# Patient Record
Sex: Female | Born: 1990 | Race: Black or African American | Hispanic: No | Marital: Single | Smoking: Never smoker
Health system: Southern US, Community
[De-identification: ages and names within clinical notes are randomized; demographics above are authoritative.]

---

## 2012-05-28 ENCOUNTER — Emergency Department (HOSPITAL_COMMUNITY)
Admission: EM | Admit: 2012-05-28 | Discharge: 2012-05-28 | Disposition: A | Discharge status: Home / Self Care | Payer: Medicaid - Out of State | Attending: Emergency Medicine | Admitting: Emergency Medicine

## 2012-05-28 ENCOUNTER — Emergency Department (HOSPITAL_COMMUNITY): Payer: Medicaid - Out of State

## 2012-05-28 ENCOUNTER — Encounter (HOSPITAL_COMMUNITY): Payer: Self-pay

## 2012-05-28 DIAGNOSIS — R0789 Other chest pain: Secondary | ICD-10-CM

## 2012-05-28 NOTE — ED Notes (Signed)
Pt. Reports having chest pain intermittent for over 1 year,  Under her lt. Breast and deep breath will increase the pain.  Usually the pain comes and goes.  Today the pain  Was constant.  Pt. Denies any sob, n/v/d. Denies any cough or congestion. Skin is w/d

## 2012-05-28 NOTE — ED Provider Notes (Signed)
History     CSN: [id number]  Arrival date & time 05/28/12  1703   First MD Initiated Contact with Patient 05/28/12 1801      Chief Complaint  Patient presents with  . Chest Pain    (Consider location/radiation/quality/duration/timing/severity/associated sxs/prior treatment) HPI  Patient is a 22 yo F presenting for one year of intermittent left sided chest pain located under her breast. When patient has an episode of pain it is sharp and lasts a second. Today she states she had one episode that lasted a little longer than normal. Does not remember a provoking factor. Pain does not require any medication to help with pain. Pain is also not positional or reproducible. Patient denies fevers, chills, SOB, nausea, vomiting, cough, or rash.   History reviewed. No pertinent past medical history.  History reviewed. No pertinent past surgical history.  No family history on file.  History  Substance Use Topics  . Smoking status: Never Smoker   . Smokeless tobacco: Not on file  . Alcohol Use: No    OB History   Grav Para Term Preterm Abortions TAB SAB Ect Mult Living                  Review of Systems  Constitutional: Negative.   HENT: Negative.   Eyes: Negative.   Respiratory: Negative for shortness of breath.   Cardiovascular: Positive for chest pain. Negative for palpitations and leg swelling.       Intermittent CP  Gastrointestinal: Negative.   Genitourinary: Negative.   Musculoskeletal: Negative.   Skin: Negative.   Neurological: Negative.     Allergies  Review of patient's allergies indicates no known allergies.  Home Medications  No current outpatient prescriptions on file.  BP 107/61  Pulse 97  Temp(Src) 99 F (37.2 C) (Oral)  Resp 18  SpO2 100%  Physical Exam  Constitutional: She is oriented to person, place, and time. She appears well-developed and well-nourished.  HENT:  Head: Normocephalic and atraumatic.  Eyes: EOM are normal. Pupils are equal,  round, and reactive to light.  Cardiovascular: Normal rate, regular rhythm and normal heart sounds.   Pulmonary/Chest: Effort normal and breath sounds normal. Chest wall is not dull to percussion. She exhibits no mass, no tenderness, no bony tenderness, no laceration, no crepitus, no edema, no deformity, no swelling and no retraction.  Abdominal: Soft. Bowel sounds are normal.  Neurological: She is alert and oriented to person, place, and time.  Skin: Skin is warm and dry.  Psychiatric: She has a normal mood and affect.    ED Course  Procedures (including critical care time)   Date: 05/28/2012  Rate: 88  Rhythm: normal sinus rhythm  QRS Axis: normal  Intervals: normal  ST/T Wave abnormalities: normal  Conduction Disutrbances:none  Narrative Interpretation:   Old EKG Reviewed: none available    Labs Reviewed - No data to display Dg Chest 2 View  05/28/2012  *RADIOLOGY REPORT*  Clinical Data: Chest pain  CHEST - 2 VIEW  Comparison: None.  Findings:  Cardiopericardial silhouette within normal limits. Mediastinal contours normal. Trachea midline.  No airspace disease or effusion.  No pneumothorax.  Buttons projects over the right chest.  IMPRESSION: No active cardiopulmonary disease.   Original Report Authenticated By: Andreas Newport, M.D.      1. Atypical chest pain       MDM  Patient is to be discharged with recommendation to follow up with PCP in regards to today's hospital visit. Chest pain  is not likely of cardiac or pulmonary etiology d/t presentation, perc negative, VSS, no tracheal deviation, no JVD or new murmur, RRR, breath sounds equal bilaterally, EKG without acute abnormalities and negative CXR. Pt is asymptomatic in ED. Advised to return to the ED is CP becomes exertional, associated with diaphoresis or nausea, radiates to left jaw/arm, worsens or becomes concerning in any way. Pt appears reliable for follow up and is agreeable to discharge. Case has been discussed with  and seen by Dr. Ethelda Chick who agrees with the above plan to discharge.          Jeannetta Ellis, PA-C 05/29/12 [phone]

## 2012-05-28 NOTE — ED Provider Notes (Signed)
Point of left chest pain under left breast onset one year ago last split-second at the time no shortness of breath no nausea no sweatiness. She presently asymptomatic. On exam no distress lungs clear auscultation heart regular in rhythm no murmurs rubs Chest x-ray viewed by me. EKG reviewed by me  Doug Sou, MD 05/28/12 2007

## 2012-05-29 NOTE — ED Provider Notes (Signed)
Medical screening examination/treatment/procedure(s) were conducted as a shared visit with non-physician practitioner(s) and myself.  I personally evaluated the patient during the encounter  Doug Sou, MD 05/29/12 1457

## 2012-11-27 ENCOUNTER — Encounter (HOSPITAL_COMMUNITY): Payer: Self-pay | Admitting: Emergency Medicine

## 2012-11-27 ENCOUNTER — Emergency Department (HOSPITAL_COMMUNITY)
Admission: EM | Admit: 2012-11-27 | Discharge: 2012-11-27 | Discharge status: Against Medical Advice | Payer: Medicaid - Out of State | Attending: Emergency Medicine | Admitting: Emergency Medicine

## 2012-11-27 DIAGNOSIS — N390 Urinary tract infection, site not specified: Secondary | ICD-10-CM | POA: Insufficient documentation

## 2012-11-27 DIAGNOSIS — N898 Other specified noninflammatory disorders of vagina: Secondary | ICD-10-CM

## 2012-11-27 DIAGNOSIS — Z3202 Encounter for pregnancy test, result negative: Secondary | ICD-10-CM | POA: Insufficient documentation

## 2012-11-27 LAB — URINALYSIS, ROUTINE W REFLEX MICROSCOPIC
Ketones, ur: NEGATIVE mg/dL
Nitrite: NEGATIVE
Specific Gravity, Urine: 1.02 (ref 1.005–1.030)
pH: 7.5 (ref 5.0–8.0)

## 2012-11-27 LAB — URINE MICROSCOPIC-ADD ON

## 2012-11-27 NOTE — ED Notes (Signed)
Pt reports abd cramping x1 2 days ago and vaginal discharge x1 week

## 2012-11-27 NOTE — ED Provider Notes (Signed)
Medical screening examination/treatment/procedure(s) were performed by non-physician practitioner and as supervising physician I was immediately available for consultation/collaboration.  EKG Interpretation   None        Glynn Octave, MD 11/27/12 1800

## 2012-11-27 NOTE — ED Notes (Signed)
Went in to assist with a pelvic exam with Scarlette Calico, [place] and pt was gone. Gown found lying on the stretcher.

## 2012-11-27 NOTE — ED Provider Notes (Signed)
CSN: [id number]     Arrival date & time 11/27/12  1023 History   First MD Initiated Contact with Patient 11/27/12 1033     Chief Complaint  Patient presents with  . Abdominal Pain   (Consider location/radiation/quality/duration/timing/severity/associated sxs/prior Treatment) HPI Comments: Patient here with acute onset of crampy epigastric pain 2 days ago  - she reports that this lasted for about 5 minutes then went away spontaneously.  She reports there was no fever, chills, nausea, vomiting, diarrhea, constipation.  She does report green vaginal discharge for the past week.  Denies pregnancy, vaginal bleeding, dysuria, hematuria.  Patient is a 22 y.o. female presenting with abdominal pain. The history is provided by the patient. No language interpreter was used.  Abdominal Pain Pain location:  Epigastric Pain quality: cramping   Pain radiates to:  Does not radiate Pain severity:  No pain Onset quality:  Sudden Timing:  Unable to specify Progression:  Resolved Chronicity:  New Context: not diet changes, not eating, not recent illness, not sick contacts and not suspicious food intake   Relieved by:  Nothing Worsened by:  Nothing tried Ineffective treatments:  None tried Associated symptoms: vaginal discharge   Associated symptoms: no anorexia, no chest pain, no chills, no cough, no diarrhea, no dysuria, no fever, no hematemesis, no hematuria, no melena, no nausea, no vaginal bleeding and no vomiting     History reviewed. No pertinent past medical history. History reviewed. No pertinent past surgical history. History reviewed. No pertinent family history. History  Substance Use Topics  . Smoking status: Never Smoker   . Smokeless tobacco: Not on file  . Alcohol Use: No   OB History   Grav Para Term Preterm Abortions TAB SAB Ect Mult Living                 Review of Systems  Constitutional: Negative for fever and chills.  Respiratory: Negative for cough.   Cardiovascular:  Negative for chest pain.  Gastrointestinal: Positive for abdominal pain. Negative for nausea, vomiting, diarrhea, melena, anorexia and hematemesis.  Genitourinary: Positive for vaginal discharge. Negative for dysuria, hematuria and vaginal bleeding.  All other systems reviewed and are negative.    Allergies  Review of patient's allergies indicates no known allergies.  Home Medications  No current outpatient prescriptions on file. BP 121/77  Pulse 106  Temp(Src) 98.7 F (37.1 C) (Oral)  Resp 16  Ht 5\' 1"  (1.549 m)  Wt 120 lb (54.432 kg)  BMI 22.69 kg/m2  SpO2 99% Physical Exam  Nursing note and vitals reviewed. Constitutional: She is oriented to person, place, and time. She appears well-developed and well-nourished. No distress.  HENT:  Head: Normocephalic and atraumatic.  Right Ear: External ear normal.  Left Ear: External ear normal.  Nose: Nose normal.  Mouth/Throat: Oropharynx is clear and moist. No oropharyngeal exudate.  Eyes: Conjunctivae are normal. Pupils are equal, round, and reactive to light. No scleral icterus.  Neck: Normal range of motion. Neck supple.  Cardiovascular: Normal rate, regular rhythm and normal heart sounds.  Exam reveals no gallop and no friction rub.   No murmur heard. Pulmonary/Chest: Effort normal and breath sounds normal. No respiratory distress. She has no wheezes. She has no rales. She exhibits no tenderness.  Abdominal: Soft. Bowel sounds are normal. She exhibits no distension and no mass. There is no tenderness. There is no rebound and no guarding.  Genitourinary:  Unable to do pelvic exam, patient left without telling staff  Musculoskeletal: Normal range  of motion. She exhibits no edema and no tenderness.  Lymphadenopathy:    She has cervical adenopathy.  Neurological: She is alert and oriented to person, place, and time. She exhibits normal muscle tone. Coordination normal.  Skin: Skin is warm and dry. No rash noted. No erythema. No  pallor.  Psychiatric: Her behavior is normal. Judgment and thought content normal.  Flat affect, does not look at me while I am talking to her    ED Course  Procedures (including critical care time) Labs Review Labs Reviewed  URINALYSIS, ROUTINE W REFLEX MICROSCOPIC - Abnormal; Notable for the following:    APPearance TURBID (*)    Leukocytes, UA MODERATE (*)    All other components within normal limits  URINE MICROSCOPIC-ADD ON - Abnormal; Notable for the following:    Bacteria, UA FEW (*)    All other components within normal limits  GC/CHLAMYDIA PROBE AMP  WET PREP, GENITAL  URINE CULTURE  POCT PREGNANCY, URINE   Imaging Review No results found.  EKG Interpretation   None      Results for orders placed during the hospital encounter of 11/27/12  URINALYSIS, ROUTINE W REFLEX MICROSCOPIC      Result Value Range   Color, Urine YELLOW  YELLOW   APPearance TURBID (*) CLEAR   Specific Gravity, Urine 1.020  1.005 - 1.030   pH 7.5  5.0 - 8.0   Glucose, UA NEGATIVE  NEGATIVE mg/dL   Hgb urine dipstick NEGATIVE  NEGATIVE   Bilirubin Urine NEGATIVE  NEGATIVE   Ketones, ur NEGATIVE  NEGATIVE mg/dL   Protein, ur NEGATIVE  NEGATIVE mg/dL   Urobilinogen, UA 1.0  0.0 - 1.0 mg/dL   Nitrite NEGATIVE  NEGATIVE   Leukocytes, UA MODERATE (*) NEGATIVE  URINE MICROSCOPIC-ADD ON      Result Value Range   Squamous Epithelial / LPF RARE  RARE   WBC, UA 11-20  <3 WBC/hpf   RBC / HPF 3-6  <3 RBC/hpf   Bacteria, UA FEW (*) RARE   Urine-Other AMORPHOUS URATES/PHOSPHATES    POCT PREGNANCY, URINE      Result Value Range   Preg Test, Ur NEGATIVE  NEGATIVE   No results found.    MDM  UTI Vaginal discharge  Patient here with one episode of abdominal cramping without fever, chills, nausea, vomiting, I do not suspect acute emergency condition as there has been no return of pain.  I attempted to do pelvic examination but when I went into the room to do this, I discovered the patient had  left without telling the staff.  She is noted to have a UTI, should she return she needs the pelvic and UTI treatment.  Izola Price Marisue Humble, PA-C 11/27/12 1718

## 2012-11-27 NOTE — ED Notes (Signed)
Pt reports generalized abd cramping x1 two days ago, thick yellow vaginal discharge x1 week. Pt denies N/V/D, abnormal vaginal odor, burning with urination, or fevers.

## 2012-11-28 LAB — URINE CULTURE

## 2012-12-02 ENCOUNTER — Emergency Department (HOSPITAL_COMMUNITY)
Admission: EM | Admit: 2012-12-02 | Discharge: 2012-12-03 | Disposition: A | Discharge status: Home / Self Care | Payer: Medicaid - Out of State | Attending: Emergency Medicine | Admitting: Emergency Medicine

## 2012-12-02 ENCOUNTER — Encounter (HOSPITAL_COMMUNITY): Payer: Self-pay | Admitting: Emergency Medicine

## 2012-12-02 DIAGNOSIS — A5901 Trichomonal vulvovaginitis: Secondary | ICD-10-CM | POA: Insufficient documentation

## 2012-12-02 DIAGNOSIS — Z3202 Encounter for pregnancy test, result negative: Secondary | ICD-10-CM | POA: Insufficient documentation

## 2012-12-02 DIAGNOSIS — A599 Trichomoniasis, unspecified: Secondary | ICD-10-CM

## 2012-12-02 DIAGNOSIS — N898 Other specified noninflammatory disorders of vagina: Secondary | ICD-10-CM

## 2012-12-02 LAB — URINALYSIS, ROUTINE W REFLEX MICROSCOPIC
Bilirubin Urine: NEGATIVE
Glucose, UA: NEGATIVE mg/dL
Ketones, ur: NEGATIVE mg/dL
Nitrite: NEGATIVE
Protein, ur: NEGATIVE mg/dL
Specific Gravity, Urine: 1.018 (ref 1.005–1.030)
Urobilinogen, UA: 1 mg/dL (ref 0.0–1.0)
pH: 7 (ref 5.0–8.0)

## 2012-12-02 LAB — URINE MICROSCOPIC-ADD ON

## 2012-12-02 LAB — POCT PREGNANCY, URINE: Preg Test, Ur: NEGATIVE

## 2012-12-02 MED ORDER — AZITHROMYCIN 1 G PO PACK
1.0000 g | PACK | Freq: Once | ORAL | Status: AC
  Administered 2012-12-02: 1 g via ORAL
  Filled 2012-12-02: qty 1

## 2012-12-02 MED ORDER — CEFTRIAXONE SODIUM 250 MG IJ SOLR
250.0000 mg | Freq: Once | INTRAMUSCULAR | Status: AC
  Administered 2012-12-03: 250 mg via INTRAMUSCULAR
  Filled 2012-12-02: qty 250

## 2012-12-02 MED ORDER — LIDOCAINE HCL (PF) 1 % IJ SOLN
INTRAMUSCULAR | Status: AC
  Administered 2012-12-03: 1.8 mL
  Filled 2012-12-02: qty 5

## 2012-12-02 NOTE — ED Notes (Signed)
Pt reports vaginal discharge for several days.  

## 2012-12-02 NOTE — ED Notes (Signed)
Pt c/o vaginal discharge x's 1 week.  St's she was here on Fri. But left before tx was finished.

## 2012-12-02 NOTE — ED Provider Notes (Signed)
CSN: [id number]     Arrival date & time 12/02/12  2224 History   First MD Initiated Contact with Patient 12/02/12 2251     Chief Complaint  Patient presents with  . Vaginal Discharge   (Consider location/radiation/quality/duration/timing/severity/associated sxs/prior Treatment) HPI  21yF with vaginal discharge. Onset about a week ago. Denies any other complaints. Seen in ED several days ago but left prior to pelvic exam. Return now because of continued whitish discharge. No fever or chills. No urinary complaints. No n/v. Doesn't think she is pregnant. Labs from last visit reviewed. Leuks on UA, but cultures neg after 5 days.   History reviewed. No pertinent past medical history. History reviewed. No pertinent past surgical history. No family history on file. History  Substance Use Topics  . Smoking status: Never Smoker   . Smokeless tobacco: Not on file  . Alcohol Use: No   OB History   Grav Para Term Preterm Abortions TAB SAB Ect Mult Living                 Review of Systems  All systems reviewed and negative, other than as noted in HPI.  Allergies  Review of patient's allergies indicates no known allergies.  Home Medications   Current Outpatient Rx  Name  Route  Sig  Dispense  Refill  . medroxyPROGESTERone (DEPO-PROVERA) 150 MG/ML injection   Intramuscular   Inject 150 mg into the muscle every 3 (three) months.          BP 109/68  Pulse 101  Resp 18  Wt 112 lb 4.8 oz (50.939 kg)  SpO2 97% Physical Exam  Nursing note and vitals reviewed. Constitutional: She appears well-developed and well-nourished. No distress.  HENT:  Head: Normocephalic and atraumatic.  Eyes: Conjunctivae are normal. Right eye exhibits no discharge. Left eye exhibits no discharge.  Neck: Neck supple.  Cardiovascular: Normal rate, regular rhythm and normal heart sounds.  Exam reveals no gallop and no friction rub.   No murmur heard. Pulmonary/Chest: Effort normal and breath sounds normal.  No respiratory distress.  Abdominal: Soft. She exhibits no distension. There is no tenderness.  Genitourinary:  Chaperone present. Perineum normal to inspection. No lesions noted.  Moderate amount of whitish discharge. No cervical lesions. No CMT, adnexal mass or tenderness appreciated.   Musculoskeletal: She exhibits no edema and no tenderness.  Neurological: She is alert.  Skin: Skin is warm and dry.  Psychiatric: She has a normal mood and affect. Her behavior is normal. Thought content normal.    ED Course  Procedures (including critical care time) Labs Review Labs Reviewed  WET PREP, GENITAL - Abnormal; Notable for the following:    Trich, Wet Prep FEW (*)    WBC, Wet Prep HPF POC TOO NUMEROUS TO COUNT (*)    All other components within normal limits  URINALYSIS, ROUTINE W REFLEX MICROSCOPIC - Abnormal; Notable for the following:    APPearance CLOUDY (*)    Hgb urine dipstick SMALL (*)    Leukocytes, UA LARGE (*)    All other components within normal limits  GC/CHLAMYDIA PROBE AMP  URINE MICROSCOPIC-ADD ON  POCT PREGNANCY, URINE   Imaging Review No results found.  EKG Interpretation   None       MDM   1. Vaginal discharge    21yF with vaginal discharge. No abdominal/pelvic pain. Benign abdomen. Pelvic exam unremarkable aside from moderate discharge. Pt requesting empiric GC tx. Trich noted on wet prep. Flagyl. Outpt FU. Return precautions discussed.  Raeford Razor, MD 12/03/12 [phone]

## 2012-12-03 LAB — WET PREP, GENITAL
Clue Cells Wet Prep HPF POC: NONE SEEN
Yeast Wet Prep HPF POC: NONE SEEN

## 2012-12-03 LAB — GC/CHLAMYDIA PROBE AMP
CT Probe RNA: NEGATIVE
GC Probe RNA: NEGATIVE

## 2012-12-03 MED ORDER — METRONIDAZOLE 500 MG PO TABS: 500.0000 mg | ORAL_TABLET | Freq: Two times a day (BID) | ORAL | Status: DC

## 2013-01-07 ENCOUNTER — Encounter (HOSPITAL_COMMUNITY): Payer: Self-pay | Admitting: Emergency Medicine

## 2013-01-07 DIAGNOSIS — R000 Tachycardia, unspecified: Secondary | ICD-10-CM | POA: Insufficient documentation

## 2013-01-07 DIAGNOSIS — N1330 Unspecified hydronephrosis: Secondary | ICD-10-CM | POA: Insufficient documentation

## 2013-01-07 DIAGNOSIS — Z3202 Encounter for pregnancy test, result negative: Secondary | ICD-10-CM | POA: Insufficient documentation

## 2013-01-07 DIAGNOSIS — B9689 Other specified bacterial agents as the cause of diseases classified elsewhere: Secondary | ICD-10-CM | POA: Insufficient documentation

## 2013-01-07 DIAGNOSIS — N760 Acute vaginitis: Secondary | ICD-10-CM | POA: Insufficient documentation

## 2013-01-07 DIAGNOSIS — R197 Diarrhea, unspecified: Secondary | ICD-10-CM | POA: Insufficient documentation

## 2013-01-07 DIAGNOSIS — N390 Urinary tract infection, site not specified: Secondary | ICD-10-CM | POA: Insufficient documentation

## 2013-01-07 DIAGNOSIS — N23 Unspecified renal colic: Secondary | ICD-10-CM | POA: Insufficient documentation

## 2013-01-07 DIAGNOSIS — N201 Calculus of ureter: Secondary | ICD-10-CM | POA: Insufficient documentation

## 2013-01-07 DIAGNOSIS — A499 Bacterial infection, unspecified: Secondary | ICD-10-CM | POA: Insufficient documentation

## 2013-01-07 LAB — CBC WITH DIFFERENTIAL/PLATELET
Basophils Relative: 0 % (ref 0–1)
Eosinophils Absolute: 0.1 10*3/uL (ref 0.0–0.7)
Eosinophils Relative: 2 % (ref 0–5)
Lymphs Abs: 2.8 10*3/uL (ref 0.7–4.0)
MCH: 29.9 pg (ref 26.0–34.0)
MCHC: 35.3 g/dL (ref 30.0–36.0)
MCV: 84.8 fL (ref 78.0–100.0)
Neutrophils Relative %: 57 % (ref 43–77)
Platelets: 222 10*3/uL (ref 150–400)
RBC: 4.41 MIL/uL (ref 3.87–5.11)

## 2013-01-07 LAB — URINE MICROSCOPIC-ADD ON

## 2013-01-07 LAB — URINALYSIS, ROUTINE W REFLEX MICROSCOPIC
Glucose, UA: NEGATIVE mg/dL
Ketones, ur: NEGATIVE mg/dL
Leukocytes, UA: NEGATIVE
Protein, ur: NEGATIVE mg/dL
Specific Gravity, Urine: 1.018 (ref 1.005–1.030)
Urobilinogen, UA: 1 mg/dL (ref 0.0–1.0)

## 2013-01-07 NOTE — ED Notes (Signed)
Patient with abdominal pains since last night.  Patient states she has been nauseated, no vomiting at this time.  Patient states she has been having diarrhea with the pain.

## 2013-01-08 ENCOUNTER — Encounter (HOSPITAL_COMMUNITY): Payer: Self-pay | Admitting: Radiology

## 2013-01-08 ENCOUNTER — Emergency Department (HOSPITAL_COMMUNITY): Payer: Self-pay

## 2013-01-08 ENCOUNTER — Emergency Department (HOSPITAL_COMMUNITY)
Admission: EM | Admit: 2013-01-08 | Discharge: 2013-01-08 | Disposition: A | Discharge status: Home / Self Care | Payer: Self-pay | Attending: Emergency Medicine | Admitting: Emergency Medicine

## 2013-01-08 DIAGNOSIS — N390 Urinary tract infection, site not specified: Secondary | ICD-10-CM

## 2013-01-08 DIAGNOSIS — B9689 Other specified bacterial agents as the cause of diseases classified elsewhere: Secondary | ICD-10-CM

## 2013-01-08 DIAGNOSIS — R1031 Right lower quadrant pain: Secondary | ICD-10-CM

## 2013-01-08 LAB — WET PREP, GENITAL: Trich, Wet Prep: NONE SEEN

## 2013-01-08 LAB — COMPREHENSIVE METABOLIC PANEL
AST: 24 U/L (ref 0–37)
Albumin: 4.4 g/dL (ref 3.5–5.2)
Alkaline Phosphatase: 63 U/L (ref 39–117)
BUN: 12 mg/dL (ref 6–23)
CO2: 24 mEq/L (ref 19–32)
Chloride: 106 mEq/L (ref 96–112)
GFR calc Af Amer: >90 mL/min (ref >90)
GFR calc non Af Amer: >90 mL/min (ref >90)
Potassium: 3.6 mEq/L (ref 3.5–5.1)
Total Bilirubin: 0.3 mg/dL (ref 0.3–1.2)

## 2013-01-08 LAB — LIPASE, BLOOD: Lipase: 13 U/L (ref 11–59)

## 2013-01-08 MED ORDER — MORPHINE SULFATE 2 MG/ML IJ SOLN
2.0000 mg | Freq: Once | INTRAMUSCULAR | Status: AC
  Administered 2013-01-08: 2 mg via INTRAVENOUS
  Filled 2013-01-08: qty 1

## 2013-01-08 MED ORDER — ONDANSETRON HCL 4 MG/2ML IJ SOLN
4.0000 mg | Freq: Once | INTRAMUSCULAR | Status: AC
  Administered 2013-01-08: 4 mg via INTRAVENOUS
  Filled 2013-01-08: qty 2

## 2013-01-08 MED ORDER — NAPROXEN 500 MG PO TABS: 500.0000 mg | ORAL_TABLET | Freq: Two times a day (BID) | ORAL | Status: DC

## 2013-01-08 MED ORDER — CEPHALEXIN 500 MG PO CAPS: 500.0000 mg | ORAL_CAPSULE | Freq: Two times a day (BID) | ORAL | Status: DC

## 2013-01-08 MED ORDER — TAMSULOSIN HCL 0.4 MG PO CAPS: 0.4000 mg | ORAL_CAPSULE | Freq: Two times a day (BID) | ORAL | Status: DC

## 2013-01-08 MED ORDER — ONDANSETRON 4 MG PO TBDP: 4.0000 mg | ORAL_TABLET | Freq: Three times a day (TID) | ORAL | Status: DC | PRN

## 2013-01-08 MED ORDER — IOHEXOL 300 MG/ML  SOLN
100.0000 mL | Freq: Once | INTRAMUSCULAR | Status: AC | PRN
  Administered 2013-01-08: 100 mL via INTRAVENOUS

## 2013-01-08 MED ORDER — IOHEXOL 300 MG/ML  SOLN: 25.0000 mL | INTRAMUSCULAR | Status: AC

## 2013-01-08 MED ORDER — METRONIDAZOLE 500 MG PO TABS: 500.0000 mg | ORAL_TABLET | Freq: Two times a day (BID) | ORAL | Status: DC

## 2013-01-08 MED ORDER — OXYCODONE-ACETAMINOPHEN 5-325 MG PO TABS: 1.0000 | ORAL_TABLET | ORAL | Status: DC | PRN

## 2013-01-08 NOTE — ED Provider Notes (Signed)
CSN: [id number]     Arrival date & time 01/07/13  2132 History   First MD Initiated Contact with Patient 01/08/13 0044     Chief Complaint  Patient presents with  . Abdominal Pain   (Consider location/radiation/quality/duration/timing/severity/associated sxs/prior Treatment) Patient is a 22 y.o. female presenting with abdominal pain.  Abdominal Pain Associated symptoms: no dysuria, no hematuria, no sore throat, no vaginal bleeding and no vaginal discharge    22 yo female presents with RLQ abdominal pain that started today at 2am this morning. Patient states the pain came on all at once and is sharp, constant, 10/10 and does not radiate. Never had this type of pain before. Admits to fever and chills, in addition to N/V and diarrhea. Patient denies sexual activity. Patietn on depo-provera shot, LMP was > 3 yrs ago.  History reviewed. No pertinent past medical history. History reviewed. No pertinent past surgical history. History reviewed. No pertinent family history. History  Substance Use Topics  . Smoking status: Never Smoker   . Smokeless tobacco: Not on file  . Alcohol Use: No   OB History   Grav Para Term Preterm Abortions TAB SAB Ect Mult Living                 Review of Systems  HENT: Negative for congestion, rhinorrhea, sinus pressure and sore throat.   Gastrointestinal: Positive for abdominal pain.  Genitourinary: Negative for dysuria, hematuria, flank pain, vaginal bleeding, vaginal discharge and pelvic pain.    Allergies  Review of patient's allergies indicates no known allergies.  Home Medications   Current Outpatient Rx  Name  Route  Sig  Dispense  Refill  . medroxyPROGESTERone (DEPO-PROVERA) 150 MG/ML injection   Intramuscular   Inject 150 mg into the muscle every 3 (three) months.         . cephALEXin (KEFLEX) 500 MG capsule   Oral   Take 1 capsule (500 mg total) by mouth 2 (two) times daily.   14 capsule   0   . metroNIDAZOLE (FLAGYL) 500 MG tablet   Oral   Take 1 tablet (500 mg total) by mouth 2 (two) times daily.   14 tablet   0     Do not consume alcoholic beverages with this medic ...   . naproxen (NAPROSYN) 500 MG tablet   Oral   Take 1 tablet (500 mg total) by mouth 2 (two) times daily with a meal.   30 tablet   0   . ondansetron (ZOFRAN ODT) 4 MG disintegrating tablet   Oral   Take 1 tablet (4 mg total) by mouth every 8 (eight) hours as needed for nausea.   10 tablet   0   . oxyCODONE-acetaminophen (PERCOCET) 5-325 MG per tablet   Oral   Take 1 tablet by mouth every 4 (four) hours as needed.   20 tablet   0   . tamsulosin (FLOMAX) 0.4 MG CAPS capsule   Oral   Take 1 capsule (0.4 mg total) by mouth 2 (two) times daily.   10 capsule   0    BP 123/79  Pulse 96  Temp(Src) 98.5 F (36.9 C) (Oral)  Resp 18  Ht 5\' 1"  (1.549 m)  Wt 113 lb 8 oz (51.483 kg)  BMI 21.46 kg/m2  SpO2 99% Physical Exam  Nursing note and vitals reviewed. Constitutional: She is oriented to person, place, and time. She appears well-developed and well-nourished. No distress.  HENT:  Head: Normocephalic and atraumatic.  Neck:  Normal range of motion. Neck supple.  Cardiovascular: Regular rhythm and normal heart sounds.  Tachycardia present.  Exam reveals no gallop and no friction rub.   No murmur heard. Pulmonary/Chest: Effort normal and breath sounds normal. No respiratory distress. She has no wheezes. She has no rales.  Abdominal: Soft. Bowel sounds are normal. She exhibits no distension. There is tenderness in the suprapubic area. There is guarding and tenderness at McBurney's point. There is no rebound and negative Murphy's sign.    Genitourinary: Vagina normal and uterus normal. Pelvic exam was performed with patient supine. No labial fusion. There is no rash, tenderness, lesion or injury on the right labia. There is no rash, tenderness, lesion or injury on the left labia. Uterus is not enlarged and not tender. Cervix exhibits  discharge and friability. Cervix exhibits no motion tenderness. Right adnexum displays tenderness. Left adnexum displays no mass and no tenderness. No erythema, tenderness or bleeding around the vagina. No signs of injury around the vagina.  Musculoskeletal: Normal range of motion. She exhibits no edema.  Neurological: She is alert and oriented to person, place, and time.  Skin: Skin is warm and dry. She is not diaphoretic.  Psychiatric: She has a normal mood and affect. Her behavior is normal.    ED Course  Procedures (including critical care time) Labs Review Labs Reviewed  WET PREP, GENITAL - Abnormal; Notable for the following:    Clue Cells Wet Prep HPF POC MANY (*)    WBC, Wet Prep HPF POC FEW (*)    All other components within normal limits  URINALYSIS, ROUTINE W REFLEX MICROSCOPIC - Abnormal; Notable for the following:    APPearance TURBID (*)    Hgb urine dipstick MODERATE (*)    All other components within normal limits  URINE MICROSCOPIC-ADD ON - Abnormal; Notable for the following:    Bacteria, UA MANY (*)    All other components within normal limits  COMPREHENSIVE METABOLIC PANEL - Abnormal; Notable for the following:    Glucose, Bld 115 (*)    All other components within normal limits  GC/CHLAMYDIA PROBE AMP  CBC WITH DIFFERENTIAL  LIPASE, BLOOD  POCT PREGNANCY, URINE   Imaging Review Ct Abdomen Pelvis W Contrast  01/08/2013   CLINICAL DATA:  Right lower quadrant abdominal pain  EXAM: CT ABDOMEN AND PELVIS WITH CONTRAST  TECHNIQUE: Multidetector CT imaging of the abdomen and pelvis was performed using the standard protocol following bolus administration of intravenous contrast.  CONTRAST:  OMNIPAQUE IOHEXOL 300 MG/ML  SOLN  COMPARISON:  None available  FINDINGS: The visualized lung bases are clear.  Liver demonstrates a normal contrast enhanced appearance. The gallbladder is within normal limits. The spleen, adrenal glands, and pancreas demonstrate a normal  contrast enhanced appearance.  Several nonobstructive stones are seen within the left kidney, the largest of which is located within and lower pole caliectasis and measures 5 mm. No obstructive stone seen on the left or along the course of the left renal collecting system. There is no left-sided hydronephrosis. No focal enhancing renal mass.  On the right, there is an obstructive 5 mm stone present at the right UVJ (series 2, image 68). There is secondary moderate right hydroureteronephrosis. Additional 3 mm stone is present within a lower pole calyx in the right kidney. Small amount of perinephric free fluid is present about the right kidney.  No evidence of bowel obstruction. The appendix is visualized in the right lower quadrant and is of normal caliber  and appearance without associated inflammatory changes to suggest acute appendicitis. No abnormal wall thickening, mucosal enhancement, or inflammatory fat stranding seen about the bowels.  Bladder is unremarkable. Uterus and ovaries are within normal limits. Trace free fluid is noted within the pelvis, likely physiologic.  No free intraperitoneal air identified. No pathologically enlarged intra-abdominal pelvic lymph nodes.  In osseous structures are within normal limits. No focal lytic or blastic osseous lesions.  IMPRESSION: 1. Obstructive 5 mm stone at the right UVJ with secondary moderate right hydroureteronephrosis. 2. Additional bilateral nonobstructive renal calculi as above. 3. Normal appendix.   Electronically Signed   By: Rise Mu M.D.   On: 01/08/2013 06:17    EKG Interpretation   None       MDM   1. Acute UTI   2. Bacterial vaginosis   3. Abdominal pain, RLQ (right lower quadrant)    Labs are normal. Urine consistent with UTI. Preg test negative. Wet mount shows few WBCs with Many Clue cells. Will treat patient for UTI and Bacterial vaginosis. G/C cultures pendings.   CT shows obstructive 5 mm stone at RIGHT UVJ with  secondary RIGHT hydroureteronephrosis. Bilateral nonobstructive renal calculi noted as well. Appendix appears normal.   Patient given thorough discussion of lab/imaging findings. Patient educated in detail on medications and warned of possible side effects. Encouraged to avoid alcohol with antibiotics, and avoid operating machinery or driving with pain medication. Patient confirms understanding. Plan to follow up with PCP in 2 days, or return to ED if sxs should worsen. Patient agrees with plan. Discharged in good condition. Pain controlled in ED.   Meds given in ED:  Medications  iohexol (OMNIPAQUE) 300 MG/ML solution 25 mL (not administered)  morphine 2 MG/ML injection 2 mg (2 mg Intravenous Given 01/08/13 0124)  morphine 2 MG/ML injection 2 mg (2 mg Intravenous Given 01/08/13 0229)  ondansetron (ZOFRAN) injection 4 mg (4 mg Intravenous Given 01/08/13 0333)  iohexol (OMNIPAQUE) 300 MG/ML solution 100 mL (100 mLs Intravenous Contrast Given 01/08/13 0545)  morphine 2 MG/ML injection 2 mg (2 mg Intravenous Given 01/08/13 1610)    Discharge Medication List as of 01/08/2013  6:41 AM    START taking these medications   Details  cephALEXin (KEFLEX) 500 MG capsule Take 1 capsule (500 mg total) by mouth 2 (two) times daily., Starting 01/08/2013, Until Discontinued, Print    metroNIDAZOLE (FLAGYL) 500 MG tablet Take 1 tablet (500 mg total) by mouth 2 (two) times daily., Starting 01/08/2013, Until Discontinued, Print    naproxen (NAPROSYN) 500 MG tablet Take 1 tablet (500 mg total) by mouth 2 (two) times daily with a meal., Starting 01/08/2013, Until Discontinued, Print    ondansetron (ZOFRAN ODT) 4 MG disintegrating tablet Take 1 tablet (4 mg total) by mouth every 8 (eight) hours as needed for nausea., Starting 01/08/2013, Until Discontinued, Print    oxyCODONE-acetaminophen (PERCOCET) 5-325 MG per tablet Take 1 tablet by mouth every 4 (four) hours as needed., Starting 01/08/2013, Until  Discontinued, Print    tamsulosin (FLOMAX) 0.4 MG CAPS capsule Take 1 capsule (0.4 mg total) by mouth 2 (two) times daily., Starting 01/08/2013, Until Discontinued, Print         Rudene Anda, [place] 01/08/13 2225

## 2013-01-08 NOTE — ED Notes (Signed)
CT notified PT finished drinking contrast.

## 2013-01-08 NOTE — ED Notes (Signed)
Pt given d/c instructions and verbalized understanding. NAD at this time.  

## 2013-01-08 NOTE — ED Notes (Signed)
Pt returned from CT °

## 2013-01-09 NOTE — ED Provider Notes (Signed)
22 year old female with right lower quadrant and suprapubic pain. On exam his tenderness which is reproducible to the right lower quadrant with mild guarding, she does not appear uncomfortable on my exam though this was after she was given pain medications. CT scan shows distal right ureteral stone, 5 mm with hydronephrosis and hydroureter, patient tolerating pain after medications, amenable to discharge with followup with urology.  Possible UTI associated with the stone, antibiotics for home as well.  Meds given in ED:  Medications  iohexol (OMNIPAQUE) 300 MG/ML solution 25 mL (not administered)  morphine 2 MG/ML injection 2 mg (2 mg Intravenous Given 01/08/13 0124)  morphine 2 MG/ML injection 2 mg (2 mg Intravenous Given 01/08/13 0229)  ondansetron (ZOFRAN) injection 4 mg (4 mg Intravenous Given 01/08/13 0333)  iohexol (OMNIPAQUE) 300 MG/ML solution 100 mL (100 mLs Intravenous Contrast Given 01/08/13 0545)  morphine 2 MG/ML injection 2 mg (2 mg Intravenous Given 01/08/13 1610)    Discharge Medication List as of 01/08/2013  6:41 AM    START taking these medications   Details  cephALEXin (KEFLEX) 500 MG capsule Take 1 capsule (500 mg total) by mouth 2 (two) times daily., Starting 01/08/2013, Until Discontinued, Print    metroNIDAZOLE (FLAGYL) 500 MG tablet Take 1 tablet (500 mg total) by mouth 2 (two) times daily., Starting 01/08/2013, Until Discontinued, Print    naproxen (NAPROSYN) 500 MG tablet Take 1 tablet (500 mg total) by mouth 2 (two) times daily with a meal., Starting 01/08/2013, Until Discontinued, Print    ondansetron (ZOFRAN ODT) 4 MG disintegrating tablet Take 1 tablet (4 mg total) by mouth every 8 (eight) hours as needed for nausea., Starting 01/08/2013, Until Discontinued, Print    oxyCODONE-acetaminophen (PERCOCET) 5-325 MG per tablet Take 1 tablet by mouth every 4 (four) hours as needed., Starting 01/08/2013, Until Discontinued, Print    tamsulosin (FLOMAX) 0.4 MG CAPS  capsule Take 1 capsule (0.4 mg total) by mouth 2 (two) times daily., Starting 01/08/2013, Until Discontinued, Print          Medical screening examination/treatment/procedure(s) were conducted as a shared visit with non-physician practitioner(s) and myself.  I personally evaluated the patient during the encounter.        Vida Roller, MD 01/09/13 [id number]

## 2013-04-23 ENCOUNTER — Encounter (HOSPITAL_COMMUNITY): Payer: Self-pay | Admitting: Emergency Medicine

## 2013-04-23 ENCOUNTER — Emergency Department (HOSPITAL_COMMUNITY): Payer: Self-pay

## 2013-04-23 ENCOUNTER — Emergency Department (HOSPITAL_COMMUNITY)
Admission: EM | Admit: 2013-04-23 | Discharge: 2013-04-23 | Disposition: A | Discharge status: Home / Self Care | Payer: Self-pay | Attending: Emergency Medicine | Admitting: Emergency Medicine

## 2013-04-23 DIAGNOSIS — Y9389 Activity, other specified: Secondary | ICD-10-CM | POA: Insufficient documentation

## 2013-04-23 DIAGNOSIS — Y92410 Unspecified street and highway as the place of occurrence of the external cause: Secondary | ICD-10-CM | POA: Insufficient documentation

## 2013-04-23 DIAGNOSIS — R519 Headache, unspecified: Secondary | ICD-10-CM

## 2013-04-23 DIAGNOSIS — R51 Headache: Secondary | ICD-10-CM

## 2013-04-23 DIAGNOSIS — S161XXA Strain of muscle, fascia and tendon at neck level, initial encounter: Secondary | ICD-10-CM

## 2013-04-23 DIAGNOSIS — S0990XA Unspecified injury of head, initial encounter: Secondary | ICD-10-CM | POA: Insufficient documentation

## 2013-04-23 DIAGNOSIS — S139XXA Sprain of joints and ligaments of unspecified parts of neck, initial encounter: Secondary | ICD-10-CM | POA: Insufficient documentation

## 2013-04-23 MED ORDER — DIPHENHYDRAMINE HCL 50 MG/ML IJ SOLN
25.0000 mg | Freq: Once | INTRAMUSCULAR | Status: AC
  Administered 2013-04-23: 25 mg via INTRAMUSCULAR
  Filled 2013-04-23: qty 1

## 2013-04-23 MED ORDER — METOCLOPRAMIDE HCL 5 MG/ML IJ SOLN
10.0000 mg | Freq: Once | INTRAMUSCULAR | Status: AC
  Administered 2013-04-23: 10 mg via INTRAMUSCULAR
  Filled 2013-04-23: qty 2

## 2013-04-23 MED ORDER — CYCLOBENZAPRINE HCL 10 MG PO TABS: 10.0000 mg | ORAL_TABLET | Freq: Two times a day (BID) | ORAL | Status: AC | PRN

## 2013-04-23 NOTE — ED Notes (Signed)
Pt undressed, in gown, on continuous pulse oximetry and blood pressure cuff 

## 2013-04-23 NOTE — Discharge Instructions (Signed)
Please call your doctor for a followup appointment within 24-48 hours. When you talk to your doctor please let them know that you were seen in the emergency department and have them acquire all of your records so that they can discuss the findings with you and formulate a treatment plan to fully care for your new and ongoing problems. Please call and set-up an appointment with [facility] to be re-assessed Please rest and stay hydrated Please avoid any physical or strenuous activities Please take medications as prescribed Can use Ibuprofen or Aleve as needed with medication - please take on a full stomach Please continue to monitor symptoms closely and if symptoms are to worsen or change (fever greater than 101, chills, sweating, nausea, vomiting, diarrhea, blurred vision, sudden loss of vision, worsening or changes neck pain, and the swallow, throat closing sensation, difficulty breathing, chest pain, shortness of breath, blurred vision, sudden loss of vision, room spinning sensation) please report back to emergency department immediately  Cervical Strain and Sprain (Whiplash) with Rehab Cervical strain and sprains are injuries that commonly occur with "whiplash" injuries. Whiplash occurs when the neck is forcefully whipped backward or forward, such as during a motor vehicle accident. The muscles, ligaments, tendons, discs and nerves of the neck are susceptible to injury when this occurs. SYMPTOMS   Pain or stiffness in the front and/or back of neck  Symptoms may present immediately or up to 24 hours after injury.  Dizziness, headache, nausea and vomiting.  Muscle spasm with soreness and stiffness in the neck.  Tenderness and swelling at the injury site. CAUSES  Whiplash injuries often occur during contact sports or motor vehicle accidents.  RISK INCREASES WITH:  Osteoarthritis of the spine.  Situations that make head or neck accidents or trauma more likely.  High-risk  sports (football, rugby, wrestling, hockey, auto racing, gymnastics, diving, contact karate or boxing).  Poor strength and flexibility of the neck.  Previous neck injury.  Poor tackling technique.  Improperly fitted or padded equipment. PREVENTION  Learn and use proper technique (avoid tackling with the head, spearing and head-butting; use proper falling techniques to avoid landing on the head).  Warm up and stretch properly before activity.  Maintain physical fitness:  Strength, flexibility and endurance.  Cardiovascular fitness.  Wear properly fitted and padded protective equipment, such as padded soft collars, for participation in contact sports. PROGNOSIS  Recovery for cervical strain and sprain injuries is dependent on the extent of the injury. These injuries are usually curable in 1 week to 3 months with appropriate treatment.  RELATED COMPLICATIONS   Temporary numbness and weakness may occur if the nerve roots are damaged, and this may persist until the nerve has completely healed.  Chronic pain due to frequent recurrence of symptoms.  Prolonged healing, especially if activity is resumed too soon (before complete recovery). TREATMENT  Treatment initially involves the use of ice and medication to help reduce pain and inflammation. It is also important to perform strengthening and stretching exercises and modify activities that worsen symptoms so the injury does not get worse. These exercises may be performed at home or with a therapist. For patients who experience severe symptoms, a soft padded collar may be recommended to be worn around the neck.  Improving your posture may help reduce symptoms. Posture improvement includes pulling your chin and abdomen in while sitting or standing. If you are sitting, sit in a firm chair with your buttocks against the back of the chair. While sleeping,  try replacing your pillow with a small towel rolled to 2 inches in diameter, or use a  cervical pillow or soft cervical collar. Poor sleeping positions delay healing.  For patients with nerve root damage, which causes numbness or weakness, the use of a cervical traction apparatus may be recommended. Surgery is rarely necessary for these injuries. However, cervical strain and sprains that are present at birth (congenital) may require surgery. MEDICATION   If pain medication is necessary, nonsteroidal anti-inflammatory medications, such as aspirin and ibuprofen, or other minor pain relievers, such as acetaminophen, are often recommended.  Do not take pain medication for 7 days before surgery.  Prescription pain relievers may be given if deemed necessary by your caregiver. Use only as directed and only as much as you need. HEAT AND COLD:   Cold treatment (icing) relieves pain and reduces inflammation. Cold treatment should be applied for 10 to 15 minutes every 2 to 3 hours for inflammation and pain and immediately after any activity that aggravates your symptoms. Use ice packs or an ice massage.  Heat treatment may be used prior to performing the stretching and strengthening activities prescribed by your caregiver, physical therapist, or athletic trainer. Use a heat pack or a warm soak. SEEK MEDICAL CARE IF:   Symptoms get worse or do not improve in 2 weeks despite treatment.  New, unexplained symptoms develop (drugs used in treatment may produce side effects). EXERCISES RANGE OF MOTION (ROM) AND STRETCHING EXERCISES - Cervical Strain and Sprain These exercises may help you when beginning to rehabilitate your injury. In order to successfully resolve your symptoms, you must improve your posture. These exercises are designed to help reduce the forward-head and rounded-shoulder posture which contributes to this condition. Your symptoms may resolve with or without further involvement from your physician, physical therapist or athletic trainer. While completing these exercises, remember:     Restoring tissue flexibility helps normal motion to return to the joints. This allows healthier, less painful movement and activity.  An effective stretch should be held for at least 20 seconds, although you may need to begin with shorter hold times for comfort.  A stretch should never be painful. You should only feel a gentle lengthening or release in the stretched tissue. STRETCH- Axial Extensors  Lie on your back on the floor. You may bend your knees for comfort. Place a rolled up hand towel or dish towel, about 2 inches in diameter, under the part of your head that makes contact with the floor.  Gently tuck your chin, as if trying to make a "double chin," until you feel a gentle stretch at the base of your head.  Hold __________ seconds. Repeat __________ times. Complete this exercise __________ times per day.  STRETECH - Axial Extension   Stand or sit on a firm surface. Assume a good posture: chest up, shoulders drawn back, abdominal muscles slightly tense, knees unlocked (if standing) and feet hip width apart.  Slowly retract your chin so your head slides back and your chin slightly lowers.Continue to look straight ahead.  You should feel a gentle stretch in the back of your head. Be certain not to feel an aggressive stretch since this can cause headaches later.  Hold for __________ seconds. Repeat __________ times. Complete this exercise __________ times per day. STRETCH  Cervical Side Bend   Stand or sit on a firm surface. Assume a good posture: chest up, shoulders drawn back, abdominal muscles slightly tense, knees unlocked (if standing) and feet hip  width apart.  Without letting your nose or shoulders move, slowly tip your right / left ear to your shoulder until your feel a gentle stretch in the muscles on the opposite side of your neck.  Hold __________ seconds. Repeat __________ times. Complete this exercise __________ times per day. STRETCH  Cervical Rotators   Stand  or sit on a firm surface. Assume a good posture: chest up, shoulders drawn back, abdominal muscles slightly tense, knees unlocked (if standing) and feet hip width apart.  Keeping your eyes level with the ground, slowly turn your head until you feel a gentle stretch along the back and opposite side of your neck.  Hold __________ seconds. Repeat __________ times. Complete this exercise __________ times per day. RANGE OF MOTION - Neck Circles   Stand or sit on a firm surface. Assume a good posture: chest up, shoulders drawn back, abdominal muscles slightly tense, knees unlocked (if standing) and feet hip width apart.  Gently roll your head down and around from the back of one shoulder to the back of the other. The motion should never be forced or painful.  Repeat the motion 10-20 times, or until you feel the neck muscles relax and loosen. Repeat __________ times. Complete the exercise __________ times per day. STRENGTHENING EXERCISES - Cervical Strain and Sprain These exercises may help you when beginning to rehabilitate your injury. They may resolve your symptoms with or without further involvement from your physician, physical therapist or athletic trainer. While completing these exercises, remember:   Muscles can gain both the endurance and the strength needed for everyday activities through controlled exercises.  Complete these exercises as instructed by your physician, physical therapist or athletic trainer. Progress the resistance and repetitions only as guided.  You may experience muscle soreness or fatigue, but the pain or discomfort you are trying to eliminate should never worsen during these exercises. If this pain does worsen, stop and make certain you are following the directions exactly. If the pain is still present after adjustments, discontinue the exercise until you can discuss the trouble with your clinician. STRENGTH Cervical Flexors, Isometric  Face a wall, standing about 6  inches away. Place a small pillow, a ball about 6-8 inches in diameter, or a folded towel between your forehead and the wall.  Slightly tuck your chin and gently push your forehead into the soft object. Push only with mild to moderate intensity, building up tension gradually. Keep your jaw and forehead relaxed.  Hold 10 to 20 seconds. Keep your breathing relaxed.  Release the tension slowly. Relax your neck muscles completely before you start the next repetition. Repeat __________ times. Complete this exercise __________ times per day. STRENGTH- Cervical Lateral Flexors, Isometric   Stand about 6 inches away from a wall. Place a small pillow, a ball about 6-8 inches in diameter, or a folded towel between the side of your head and the wall.  Slightly tuck your chin and gently tilt your head into the soft object. Push only with mild to moderate intensity, building up tension gradually. Keep your jaw and forehead relaxed.  Hold 10 to 20 seconds. Keep your breathing relaxed.  Release the tension slowly. Relax your neck muscles completely before you start the next repetition. Repeat __________ times. Complete this exercise __________ times per day. STRENGTH  Cervical Extensors, Isometric   Stand about 6 inches away from a wall. Place a small pillow, a ball about 6-8 inches in diameter, or a folded towel between the back of  your head and the wall.  Slightly tuck your chin and gently tilt your head back into the soft object. Push only with mild to moderate intensity, building up tension gradually. Keep your jaw and forehead relaxed.  Hold 10 to 20 seconds. Keep your breathing relaxed.  Release the tension slowly. Relax your neck muscles completely before you start the next repetition. Repeat __________ times. Complete this exercise __________ times per day. POSTURE AND BODY MECHANICS CONSIDERATIONS - Cervical Strain and Sprain Keeping correct posture when sitting, standing or completing your  activities will reduce the stress put on different body tissues, allowing injured tissues a chance to heal and limiting painful experiences. The following are general guidelines for improved posture. Your physician or physical therapist will provide you with any instructions specific to your needs. While reading these guidelines, remember:  The exercises prescribed by your provider will help you have the flexibility and strength to maintain correct postures.  The correct posture provides the optimal environment for your joints to work. All of your joints have less wear and tear when properly supported by a spine with good posture. This means you will experience a healthier, less painful body.  Correct posture must be practiced with all of your activities, especially prolonged sitting and standing. Correct posture is as important when doing repetitive low-stress activities (typing) as it is when doing a single heavy-load activity (lifting). PROLONGED STANDING WHILE SLIGHTLY LEANING FORWARD When completing a task that requires you to lean forward while standing in one place for a long time, place either foot up on a stationary 2-4 inch high object to help maintain the best posture. When both feet are on the ground, the low back tends to lose its slight inward curve. If this curve flattens (or becomes too large), then the back and your other joints will experience too much stress, fatigue more quickly and can cause pain.  RESTING POSITIONS Consider which positions are most painful for you when choosing a resting position. If you have pain with flexion-based activities (sitting, bending, stooping, squatting), choose a position that allows you to rest in a less flexed posture. You would want to avoid curling into a fetal position on your side. If your pain worsens with extension-based activities (prolonged standing, working overhead), avoid resting in an extended position such as sleeping on your stomach. Most  people will find more comfort when they rest with their spine in a more neutral position, neither too rounded nor too arched. Lying on a non-sagging bed on your side with a pillow between your knees, or on your back with a pillow under your knees will often provide some relief. Keep in mind, being in any one position for a prolonged period of time, no matter how correct your posture, can still lead to stiffness. WALKING Walk with an upright posture. Your ears, shoulders and hips should all line-up. OFFICE WORK When working at a desk, create an environment that supports good, upright posture. Without extra support, muscles fatigue and lead to excessive strain on joints and other tissues. CHAIR:  A chair should be able to slide under your desk when your back makes contact with the back of the chair. This allows you to work closely.  The chair's height should allow your eyes to be level with the upper part of your monitor and your hands to be slightly lower than your elbows.  Body position:  Your feet should make contact with the floor. If this is not possible, use a foot  rest.  Keep your ears over your shoulders. This will reduce stress on your neck and low back. Document Released: 01/14/2005 Document Revised: 05/11/2012 Document Reviewed: 04/28/2008 [facility] Patient Information 2014 [place], [place].   Emergency Department Resource Guide 1) Find a Doctor and Pay Out of Pocket Although you won't have to find out who is covered by your insurance plan, it is a good idea to ask around and get recommendations. You will then need to call the office and see if the doctor you have chosen will accept you as a new patient and what types of options they offer for patients who are self-pay. Some doctors offer discounts or will set up payment plans for their patients who do not have insurance, but you will need to ask so you aren't surprised when you get to your appointment.  2) Contact Your Local Health  Department Not all health departments have doctors that can see patients for sick visits, but many do, so it is worth a call to see if yours does. If you don't know where your local health department is, you can check in your phone book. The CDC also has a tool to help you locate your state's health department, and many state websites also have listings of all of their local health departments.  3) Find a Walk-in Clinic If your illness is not likely to be very severe or complicated, you may want to try a walk in clinic. These are popping up all over the country in pharmacies, drugstores, and shopping centers. They're usually staffed by nurse practitioners or physician assistants that have been trained to treat common illnesses and complaints. They're usually fairly quick and inexpensive. However, if you have serious medical issues or chronic medical problems, these are probably not your best option.  No Primary Care Doctor: - Call Health Connect at  [phone] - they can help you locate a primary care doctor that  accepts your insurance, provides certain services, etc. - Physician Referral Service- [phone]  Chronic Pain Problems: Organization         Address  Phone   Notes  [facility]  [phone] Patients need to be referred by their primary care doctor.   Medication Assistance: Organization         Address  Phone   Notes  [facility] [place] [address] [place] [place], [place] [address] [phone] --Must be a resident of [facility] -- Must have NO insurance coverage whatsoever (no Medicaid/ Medicare, etc.) -- The pt. MUST have a primary care doctor that directs their care regularly and follows them in the community   [facility]  [phone]   [employer]  [phone]    Agencies that provide inexpensive medical care: Organization         Address  Phone   Notes  [facility]  [phone]   [facility]    [phone]   [facility] [address] [place], [place] [address] [phone]   [facility] [address] [place], [place] [phone]   [facility]    [phone]   [facility]    [phone]   [facility]  [address],  Phone:  [phone], Fax:  [phone] Hours of Operation:  9 am - 6 pm, M-F.  Also accepts Medicaid/Medicare and self-pay.  [facility]  [address], [place] Phone: [phone], Fax: [phone]. Hours of Operation:  8:30 am - 5:30 pm, M-F.  Also accepts Medicaid and self-pay.  [facility] [place] [address] [place] Phone: [phone]   [facility] [address] Natasha Bence [place], [place] [phone], Ext. [phone] Mondays & Thursdays: 7-9 AM.  First 15 patients are seen on a first come, first serve basis.    Medicaid-accepting [facility] Providers:  Organization         Address  Phone   Notes  [facility] [address], [place] [phone] Also accepts self-pay patients.  [facility] [address] Laurell Josephs [place], [place]  [phone]   [facility] [address], [place] [phone]   [facility] [address] [place] [phone]   Renaye Rakers [address], [place]   [phone] Only accepts [place] patients after they have their name applied to their card.   Self-Pay (no insurance) in [place]:  Organization         Address  Phone   Notes  Sickle Cell Patients, [facility] [address] [place], [place] [phone]   [facility] [address] [place], [place] [phone]   [facility] [place]  [address],  [place] [phone]   [facility]/Dr. Osei-Bonsu  [address], [place] or [address], [place] [phone] Phone number for both [place] and [place] locations is the same.  [facility] [address] [place] [phone]   [facility] [address] [place] or [address] [phone]   [facility] [address] [place] [phone], phone; [phone], fax Sees patients 1st and 3rd Saturday of every month.  Must not qualify for public or private insurance (i.e. Medicaid, Medicare, [place] Health Choice, Veterans' Benefits)  Household income should be no more than 200% of the poverty level The clinic cannot treat you if you are pregnant or think you are pregnant  Sexually transmitted diseases are not treated at the clinic.    Dental Care: Organization         Address  Phone  Notes  [facility] Department of [facility] [address] [place], [place] [phone] Accepts children up to age 78 who are enrolled in [place] or [place] Health Choice; pregnant women with a Medicaid card; and children who have applied for Medicaid or [place] Health Choice, but were declined, whose parents can pay a reduced fee at time of service.  [facility] Department of [facility]  [address], [place] [phone] Accepts children up to age 19 who are enrolled in [place] or [place] Health Choice; pregnant women with a Medicaid card; and children who have applied for Medicaid or  Health Choice, but were declined, whose parents can pay a reduced fee at time of service.  [facility]  [address] [place], [place] [phone] Patients are seen by appointment only. Walk-ins are not accepted. [facility] will see patients 89 years of age and older. Monday - Tuesday (8am-5pm) Most Wednesdays  (8:30-5pm) $30 per visit, cash only  [facility]  [address], [facility] [phone] Patients are seen by appointment only. Walk-ins are not accepted. [facility] will see patients 72 years of age and older. One Wednesday Evening (Monthly: Volunteer Based).  $30 per visit, cash only  [employer]  [phone] for adults; Children under age 12, call Graduate Pediatric Dentistry at [phone]. Children aged 53-14, please call [phone] to request a pediatric application.  Dental services are provided in all areas of dental care including fillings, crowns and bridges, complete and partial dentures, implants, gum treatment, root canals, and extractions. Preventive care is also provided. Treatment is provided to both adults and children. Patients are selected via a lottery and there is often a waiting list.   [facility] [address] [place]  [phone] [email or web address]   [facility] [address] [place], [place] [phone], Ext. [phone] Second and Fourth Thursday of each month, opens at 6:30 AM; Clinic ends at 9 AM.  Patients are seen on a first-come first-served basis, and a limited number are seen during each clinic.   [facility]  [address] Ether Griffins [place], [place] [phone]   Eligibility Requirements You must have lived in [place], [place], or [place] counties for at least the last three months.   You cannot be eligible for state or federal sponsored [place], including [employer], [place], or [employer].   You generally cannot be eligible for healthcare insurance through your employer.    How to apply: Eligibility screenings are held every Tuesday and Wednesday afternoon from 1:00 pm until 4:00 pm. You do not need an appointment for the interview!  [facility] [address] [place], [place] [phone]   [facility]  Health Department  [phone]   [facility] Health Department  [phone]   [facility] Health Department  [phone]    Behavioral Health Resources in the Community: Intensive Outpatient Programs Organization         Address  Phone  Notes  [facility] Services [address] [place], [place] [phone]   [facility] [address], [place], [place] [phone]   [facility] [address], [place], [place]  [phone]   [facility] [address],  [place], [place] [phone] or [phone]   Substance Abuse Resources Organization         Address  Phone  Notes  [facility]  [phone]   [facility]  [phone]   [facility] [place]  [phone]   Floydene Flock  [phone]   Residential & Outpatient Substance Abuse Program  [phone]   Psychological Services Organization         Address  Phone  Notes  [facility]  [phone]   [facility]  [phone]   [facility] [address] [place] [phone] or [phone]    Mobile Crisis Teams Organization         Address  Phone  Notes  [facility], Mobile Crisis Care Unit  [phone]   [facility]  [address] [place], [place] [phone]   Doristine Locks [address] [place] [phone]    Self-Help/Support Groups Organization         Address  Phone             Notes  [facility] [place] - variety of support groups  336- [phone] Call for more information  Narcotics Anonymous (NA), [facility] [address], [place]  2 meetings at this location   [facility] Organization         Address  Phone  Notes  [facility] [address],    [place]  [phone]   [facility]  [address], [place] [phone], [place], [place]  [phone]   [facility] [address], [place] [phone] Admissions: 8am-3pm M-F  [facility] [address]    [place], [place] [phone]   [facility] [address], [place], [place] [phone]   [facility] [address]  [place], [place] [phone]   [facility] Intensive Outpatient [address], [place], [place] [phone]   [facility] ([facility] [address]  [place], [place] [phone] or [phone]   [facility] ([facility]) [address] [place], [place] [phone] Accepts Medicaid  [facility] [address]  [place] [phone] Substance Abuse/Addiction Treatment   [facility] Resources Organization         Address  Phone  Notes  [facility]  [phone]) [phone]   Angie FavaJulie Brannon, PhD [address], [address] [place], [place]   [phone]) [phone] or [phone]   [facility]   [address] [place], [place] [phone](336) [phone]   [facility] [address], [place], [place] [phone]) [phone] Insurance/Medicaid/sponsorship through [facility]  [facility] [address]                                    [place], [place] [phone]) [phone] Therapy/tele-psych/case  [facility] [address].   [place], [place] [phone]) [phone]    Dr. Lolly MustacheArfeen  [phone]   [facility]  [employer] [facility] 1) [address], [place] 2) [address], [place] 3)  [address], [place] [phone](336) [phone]  [phone]) [phone]   [facility] Child Abuse Hotline [phone]) [phone] or [phone]) [phone] (After Hours)
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
## 2013-04-25 NOTE — ED Provider Notes (Signed)
Medical screening examination/treatment/procedure(s) were performed by non-physician practitioner and as supervising physician I was immediately available for consultation/collaboration.   EKG Interpretation None       Flint MelterElliott L Tillman Kazmierski, MD 04/25/13 2133

## 2014-10-10 ENCOUNTER — Encounter (HOSPITAL_COMMUNITY): Payer: Self-pay | Admitting: Emergency Medicine

## 2014-10-10 ENCOUNTER — Emergency Department (HOSPITAL_COMMUNITY)
Admission: EM | Admit: 2014-10-10 | Discharge: 2014-10-10 | Disposition: A | Discharge status: Home / Self Care | Payer: Medicaid - Out of State | Attending: Physician Assistant | Admitting: Physician Assistant

## 2014-10-10 DIAGNOSIS — K120 Recurrent oral aphthae: Secondary | ICD-10-CM | POA: Insufficient documentation

## 2014-10-10 NOTE — ED Notes (Signed)
Patient states gum pain that started last Tuesday.   Patient states no swelling, no drainage.   Patient states hasn't taken anything for pain at home "it hasn't hurt that bad".   Patient states hasn't checked with a dentist.

## 2014-10-10 NOTE — Discharge Instructions (Signed)
Use orajel as needed for pain. You can try swishing liquid benadryl as needed for additional relief. Follow up with your regular doctor in 1 week for recheck. Return to the ER for changes or worsening symptoms.   Oral Ulcers Oral ulcers are painful, shallow sores around the lining of the mouth. They can affect the gums, the inside of the lips, and the cheeks. (Sores on the outside of the lips and on the face are different.) They typically first occur in school-aged children and teenagers. Oral ulcers may also be called canker sores or cold sores. CAUSES  Canker sores and cold sores can be caused by many factors including:  Infection.  Injury.  Sun exposure.  Medications.  Emotional stress.  Food allergies.  Vitamin deficiencies.  Toothpastes containing sodium lauryl sulfate. The herpes virus can be the cause of mouth ulcers. The first infection can be severe and cause 10 or more ulcers on the gums, tongue, and lips with fever and difficulty in swallowing. This infection usually occurs between the ages of 1 and 3 years.  SYMPTOMS  The typical sore is about  inch (6 mm) in size and is an oval or round ulcer with red borders. DIAGNOSIS  Your caregiver can diagnose simple oral ulcers by examination. Additional testing is usually not required.  TREATMENT  Treatment is aimed at pain relief. Generally, oral ulcers resolve by themselves within 1 to 2 weeks without medication and are not contagious unless caused by herpes (and other viruses). Antibiotics are not effective with mouth sores. Avoid direct contact with others until the ulcer is completely healed. See your caregiver for follow-up care as recommended. Also:  Offer a soft diet.  Encourage plenty of fluids to prevent dehydration. Popsicles and milk shakes can be helpful.  Avoid acidic and salty foods and drinks such as orange juice.  Infants and young children will often refuse to drink because of pain. Using a teaspoon, cup, or  syringe to give small amounts of fluids frequently can help prevent dehydration.  Cold compresses on the face may help reduce pain.  Pain medication can help control soreness.  A solution of diphenhydramine mixed with a liquid antacid can be useful to decrease the soreness of ulcers. Consult a caregiver for the dosing.  Liquids or ointments with a numbing ingredient may be helpful when used as recommended.  Older children and teenagers can rinse their mouth with a salt-water mixture (1/2 teaspoon of salt in 8 ounces of water) four times a day. This treatment is uncomfortable but may reduce the time the ulcers are present.  There are many over-the-counter throat lozenges and medications available for oral ulcers. Their effectiveness has not been studied.  Consult your medical caregiver prior to using homeopathic treatments for oral ulcers. SEEK MEDICAL CARE IF:   You think your child needs to be seen.  The pain worsens and you cannot control it.  There are 4 or more ulcers.  The lips and gums begin to bleed and crust.  A single mouth ulcer is near a tooth that is causing a toothache or pain.  Your child has a fever, swollen face, or swollen glands.  The ulcers began after starting a medication.  Mouth ulcers keep reoccurring or last more than 2 weeks.  You think your child is not taking adequate fluids. SEEK IMMEDIATE MEDICAL CARE IF:   Your child has a high fever.  Your child is unable to swallow or becomes dehydrated.  Your child looks or acts  very ill.  An ulcer caused by a chemical your child accidentally put in their mouth. Document Released: 02/22/2004 Document Revised: 05/31/2013 Document Reviewed: 10/06/2008 [facility] Patient Information 2015 [place], [place]. This information is not intended to replace advice given to you by your health care provider. Make sure you discuss any questions you have with your health care provider.  Canker Sores  Canker sores are  painful, open sores on the inside of the mouth and cheek. They may be white or yellow. The sores usually heal in 1 to 2 weeks. Women are more likely than men to have recurrent canker sores. CAUSES The cause of canker sores is not well understood. More than one cause is likely. Canker sores do not appear to be caused by certain types of germs (viruses or bacteria). Canker sores may be caused by:  An allergic reaction to certain foods.  Digestive problems.  Not having enough vitamin B12, folic acid, and iron.  Female sex hormones. Sores may come only during certain phases of a menstrual cycle. Often, there is improvement during pregnancy.  Genetics. Some people seem to inherit canker sore problems. Emotional stress and injuries to the mouth may trigger outbreaks, but not cause them.  DIAGNOSIS Canker sores are diagnosed by exam.  TREATMENT  Patients who have frequent bouts of canker sores may have cultures taken of the sores, blood tests, or allergy tests. This helps determine if their sores are caused by a poor diet, an allergy, or some other preventable or treatable disease.  Vitamins may prevent recurrences or reduce the severity of canker sores in people with poor nutrition.  Numbing ointments can relieve pain. These are available in drug stores without a prescription.  Anti-inflammatory steroid mouth rinses or gels may be prescribed by your caregiver for severe sores.  Oral steroids may be prescribed if you have severe, recurrent canker sores. These strong medicines can cause many side effects and should be used only under the close direction of a dentist or physician.  Mouth rinses containing the antibiotic medicine may be prescribed. They may lessen symptoms and speed healing. Healing usually happens in about 1 or 2 weeks with or without treatment. Certain antibiotic mouth rinses given to pregnant women and young children can permanently stain teeth. Talk to your caregiver about  your treatment. HOME CARE INSTRUCTIONS   Avoid foods that cause canker sores for you.  Avoid citrus juices, spicy or salty foods, and coffee until the sores are healed.  Use a soft-bristled toothbrush.  Chew your food carefully to avoid biting your cheek.  Apply topical numbing medicine to the sore to help relieve pain.  Apply a thin paste of baking soda and water to the sore to help heal the sore.  Only use mouth rinses or medicines for pain or discomfort as directed by your caregiver. SEEK MEDICAL CARE IF:   Your symptoms are not better in 1 week.  Your sores are still present after 2 weeks.  Your sores are very painful.  You have trouble breathing or swallowing.  Your sores come back frequently. Document Released: 05/11/2010 Document Revised: 05/11/2012 Document Reviewed: 05/11/2010 [facility] Patient Information 2015 [place], [place]. This information is not intended to replace advice given to you by your health care provider. Make sure you discuss any questions you have with your health care provider.

## 2014-10-10 NOTE — ED Provider Notes (Signed)
CSN: [id number]     Arrival date & time 10/10/14  0801 History  This chart was scribed for non-physician practitioner Allen Derry, PA-C working with Abelino Derrick, MD by Lyndel Safe, ED Scribe. This patient was seen in room TR06C/TR06C and the patient's care was started at 9:11 AM.     Chief Complaint  Patient presents with  . gum pain    Patient is a 24 y.o. female presenting with tooth pain. The history is provided by the patient. No language interpreter was used.  Dental Pain Location:  Lower Lower teeth location: lower, frontal gingiva  Quality:  Burning Severity:  Mild Onset quality:  Gradual Duration:  6 days Timing:  Intermittent Progression:  Unchanged Chronicity:  New Context: not trauma   Relieved by:  Nothing Worsened by:  Touching and pressure Ineffective treatments: hydrogen peroxide. Associated symptoms: oral lesions   Associated symptoms: no difficulty swallowing, no drooling, no fever, no gum swelling, no oral bleeding and no trismus   Risk factors: no cancer, no diabetes and no immunosuppression    HPI Comments: Diana Castillo is a 24 y.o. female, with no chronic medical conditions, who presents to the Emergency Department complaining of 6 days of gradual onset, intermittent, 2/10 burning-type lower frontal gingiva pain that is only present with movement of mouth/pressure to the area and unrelieved with hydrogen peroxide. The affected area is erythematous. Denies injury to the area, any new foods or medicines, dental pain, discharge or swelling from the affected area, sore throat, rhinorrhea, itchy or watery eyes, fevers, chills, nausea, vomiting, diarrhea, abdominal pain, CP, SOB, hematuria, dysuria, vaginal bleeding/discharge, numbness, tingling, or weakness.  History reviewed. No pertinent past medical history. History reviewed. No pertinent past surgical history. No family history on file. Social History  Substance Use Topics  .  Smoking status: Never Smoker   . Smokeless tobacco: None  . Alcohol Use: Yes     Comment: socially   OB History    No data available     Review of Systems  Constitutional: Negative for fever and chills.  HENT: Positive for mouth sores. Negative for dental problem, drooling, rhinorrhea, sore throat and trouble swallowing.        +gum pain  Eyes: Negative for discharge and itching.  Respiratory: Negative for shortness of breath.   Cardiovascular: Negative for chest pain.  Gastrointestinal: Negative for nausea, vomiting, abdominal pain and diarrhea.  Genitourinary: Negative for dysuria, hematuria, vaginal bleeding and vaginal discharge.  Musculoskeletal: Negative for myalgias and arthralgias.  Skin: Negative for color change.  Allergic/Immunologic: Negative for immunocompromised state.  Neurological: Negative for weakness and numbness.  A complete 10 system review of systems was obtained and is otherwise negative except at noted in the HPI and PMH.  Allergies  Review of patient's allergies indicates no known allergies.  Home Medications   Prior to Admission medications   Medication Sig Start Date End Date Taking? Authorizing Provider  cyclobenzaprine (FLEXERIL) 10 MG tablet Take 1 tablet (10 mg total) by mouth 2 (two) times daily as needed for muscle spasms. 04/23/13   Marissa Sciacca, PA-C  ibuprofen (ADVIL,MOTRIN) 200 MG tablet Take 200-400 mg by mouth every 6 (six) hours as needed for fever, headache, mild pain, moderate pain or cramping.    Historical Provider, MD  medroxyPROGESTERone (DEPO-PROVERA) 150 MG/ML injection Inject 150 mg into the muscle every 3 (three) months.    Historical Provider, MD   BP 112/65 mmHg  Pulse 92  Temp(Src) 98.6 F (37  C) (Oral)  Resp 18  Ht 5\' 1"  (1.549 m)  Wt 110 lb (49.896 kg)  BMI 20.80 kg/m2  SpO2 98% Physical Exam  Constitutional: She is oriented to person, place, and time. Vital signs are normal. She appears well-developed and  well-nourished.  Non-toxic appearance. No distress.  Afebrile, nontoxic, NAD  HENT:  Head: Normocephalic and atraumatic.  Mouth/Throat: Uvula is midline, oropharynx is clear and moist and mucous membranes are normal. Oral lesions present. No trismus in the jaw. No dental abscesses, uvula swelling or dental caries.  Aphthous ulcer noted to the lower buccal mucosa at the margin of the front teeth, no drainage or swelling, mildly TTP, no dental abscess or caries.   Eyes: Conjunctivae and EOM are normal. Right eye exhibits no discharge. Left eye exhibits no discharge.  Neck: Normal range of motion. Neck supple.  Cardiovascular: Normal rate.   Pulmonary/Chest: Effort normal. No respiratory distress.  Abdominal: Normal appearance. She exhibits no distension.  Musculoskeletal: Normal range of motion.  Neurological: She is alert and oriented to person, place, and time. She has normal strength. No sensory deficit.  Skin: Skin is warm, dry and intact. No rash noted.  Psychiatric: She has a normal mood and affect. Her behavior is normal.  Nursing note and vitals reviewed.   ED Course  Procedures  DIAGNOSTIC STUDIES: Oxygen Saturation is 98% on RA, normal by my interpretation.    COORDINATION OF CARE: 9:15 AM Discussed treatment plan with pt. Pt acknowledges and agrees to plan.   MDM   Final diagnoses:  Aphthous ulcer of mouth    24 y.o. female here with aphthous ulcer to lower gumline. No surrounding swelling or abscess. Discussed diet modifications, stress reduction, and use of orajel as needed for pain. F/up with PCP in 1wk. I explained the diagnosis and have given explicit precautions to return to the ER including for any other new or worsening symptoms. The patient understands and accepts the medical plan as it's been dictated and I have answered their questions. Discharge instructions concerning home care and prescriptions have been given. The patient is STABLE and is discharged to home in  good condition.   I personally performed the services described in this documentation, which was scribed in my presence. The recorded information has been reviewed and is accurate.  BP 112/65 mmHg  Pulse 92  Temp(Src) 98.6 F (37 C) (Oral)  Resp 18  Ht 5\' 1"  (1.549 m)  Wt 110 lb (49.896 kg)  BMI 20.80 kg/m2  SpO2 98%  No orders of the defined types were placed in this encounter.      Deklyn Gibbon Camprubi-Soms, PA-C 10/10/14 0934  Courteney Randall An, MD 10/10/14 1545

## 2015-03-07 DIAGNOSIS — Z793 Long term (current) use of hormonal contraceptives: Secondary | ICD-10-CM | POA: Insufficient documentation

## 2015-03-07 DIAGNOSIS — R21 Rash and other nonspecific skin eruption: Secondary | ICD-10-CM | POA: Insufficient documentation

## 2015-03-08 ENCOUNTER — Emergency Department (HOSPITAL_COMMUNITY)
Admission: EM | Admit: 2015-03-08 | Discharge: 2015-03-08 | Disposition: A | Discharge status: Home / Self Care | Payer: No Typology Code available for payment source | Attending: Emergency Medicine | Admitting: Emergency Medicine

## 2015-03-08 ENCOUNTER — Encounter (HOSPITAL_COMMUNITY): Payer: Self-pay | Admitting: Emergency Medicine

## 2015-03-08 DIAGNOSIS — R21 Rash and other nonspecific skin eruption: Secondary | ICD-10-CM

## 2015-03-08 MED ORDER — PERMETHRIN 5 % EX CREA: TOPICAL_CREAM | CUTANEOUS | Status: AC

## 2015-03-08 MED ORDER — HYDROXYZINE HCL 25 MG PO TABS
25.0000 mg | ORAL_TABLET | Freq: Four times a day (QID) | ORAL | Status: AC
Start: 2015-03-08 — End: ?

## 2015-03-08 NOTE — ED Notes (Signed)
The patient said she started itching since Novebember.  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  She says she has "bumps" in between her fingers, her ankle and her arms.  The patient said she is not in pain but she is itching.

## 2015-03-09 NOTE — ED Provider Notes (Signed)
CSN: [id number]     Arrival date & time 03/07/15  2357 History   First MD Initiated Contact with Patient 03/08/15 0004     Chief Complaint  Patient presents with  . Rash    The patient said she started itching since Novebember.  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  She says she has "bumps" in between her fingers, her ankle and her arms.   HPI   Diana Castillo is a 25 y.o. F with no significant PMH presenting with an itchy rash since November. She states she works at [employer] and they have had a scabies outbreak. She endorses "bumps" in between her fingers, her ankle and her arms. She denies fevers, chills, pain, drainage, facial/throat swelling, SOB, CP.   History reviewed. No pertinent past medical history. History reviewed. No pertinent past surgical history. History reviewed. No pertinent family history. Social History  Substance Use Topics  . Smoking status: Never Smoker   . Smokeless tobacco: None  . Alcohol Use: Yes     Comment: socially   OB History    No data available     Review of Systems  Ten systems are reviewed and are negative for acute change except as noted in the HPI  Allergies  Review of patient's allergies indicates no known allergies.  Home Medications   Prior to Admission medications   Medication Sig Start Date End Date Taking? Authorizing Provider  cyclobenzaprine (FLEXERIL) 10 MG tablet Take 1 tablet (10 mg total) by mouth 2 (two) times daily as needed for muscle spasms. 04/23/13   Marissa Sciacca, PA-C  hydrOXYzine (ATARAX/VISTARIL) 25 MG tablet Take 1 tablet (25 mg total) by mouth every 6 (six) hours. 03/08/15   Melton Krebs, PA-C  ibuprofen (ADVIL,MOTRIN) 200 MG tablet Take 200-400 mg by mouth every 6 (six) hours as needed for fever, headache, mild pain, moderate pain or cramping.    Historical Provider, MD  medroxyPROGESTERone (DEPO-PROVERA) 150 MG/ML injection Inject  150 mg into the muscle every 3 (three) months.    Historical Provider, MD  permethrin (ELIMITE) 5 % cream Apply to affected area once 03/08/15   Melton Krebs, PA-C   BP 121/71 mmHg  Pulse 93  Temp(Src) 98.4 F (36.9 C) (Oral)  Resp 16  SpO2 100% Physical Exam  Constitutional: She is oriented to person, place, and time. She appears well-developed and well-nourished. No distress.  HENT:  Head: Normocephalic and atraumatic.  Mouth/Throat: Oropharynx is clear and moist. No oropharyngeal exudate.  Eyes: Conjunctivae are normal. Pupils are equal, round, and reactive to light. Right eye exhibits no discharge. Left eye exhibits no discharge. No scleral icterus.  Neck: No tracheal deviation present.  Cardiovascular: Normal rate, regular rhythm, normal heart sounds and intact distal pulses.  Exam reveals no gallop and no friction rub.   No murmur heard. Pulmonary/Chest: Effort normal and breath sounds normal. No respiratory distress. She has no wheezes. She has no rales. She exhibits no tenderness.  Abdominal: She exhibits no distension.  Musculoskeletal: Normal range of motion. She exhibits no edema or tenderness.  Lymphadenopathy:    She has no cervical adenopathy.  Neurological: She is alert and oriented to person, place, and time. Coordination normal.  Skin: Skin is warm and dry. Rash noted. She is not diaphoretic. There is erythema.  Diffuse, scattered, mildly erythematous maculopapular blanching lesions (about 3). Hands are spared. Skin appears dry.   Psychiatric: She has  a normal mood and affect. Her behavior is normal.  Nursing note and vitals reviewed.   ED Course  Procedures   MDM   Final diagnoses:  Rash   Patient nontoxic appearing, VSS. Most likely dry skin. There are few, scattered lesions, none are in finger webbings, and do not have classic scabies presentation. If outbreak started in November, I would suspect there to be many more lesions. This is less likely  scabies but given outbreak and patient's concern, will treat empirically.   Patient may be safely discharged home. Discussed reasons for return. Patient to follow-up with dermatology if she does not improve. Patient in understanding and agreement with the plan.   Melton Krebs, PA-C 03/09/15 2133  Devoria Albe, MD 03/11/15 [phone]

## 2015-06-16 IMAGING — CT CT ABD-PELV W/ CM
2 of 4 series · 16 of 46 positions shown, 18 images · IV contrast (CONTRAST)
Comparison: None available

CLINICAL DATA: Right lower quadrant abdominal pain

EXAM:
CT ABDOMEN AND PELVIS WITH CONTRAST
TECHNIQUE: Multidetector CT imaging of the abdomen and pelvis was performed
using the standard protocol following bolus administration of
intravenous contrast.
CONTRAST:  100mL OMNIPAQUE IOHEXOL 300 MG/ML  SOLN

[Series 2: routine · axial · 0.63mm/px · z∈[+654,+1024]mm · 13 of 82 slices shown, 15 images]
[im 4/82  soft-tissue]
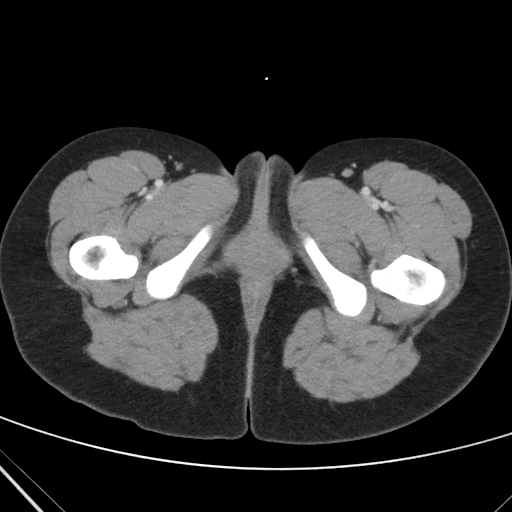
[im 4/82  bone]
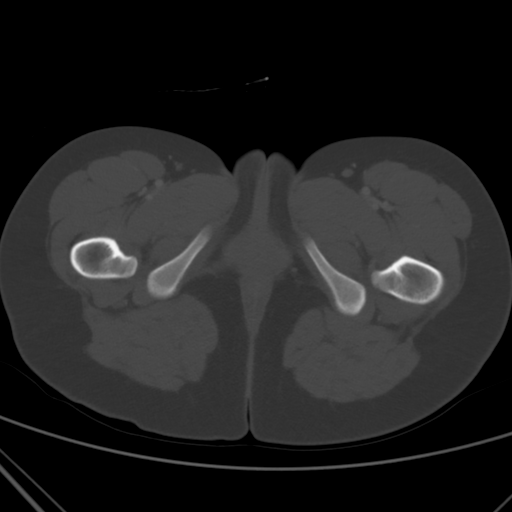
[im 12/82  soft-tissue]
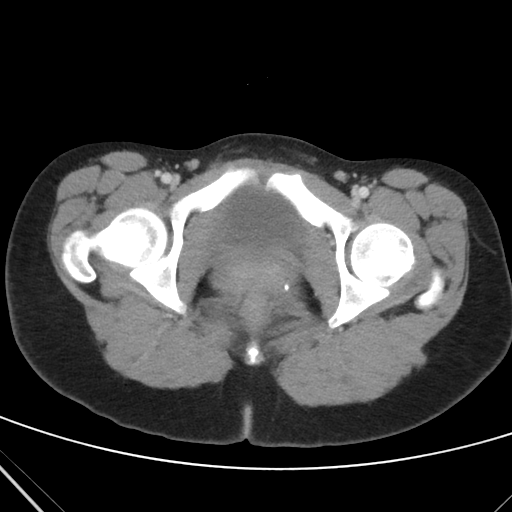
[im 19/82  soft-tissue]
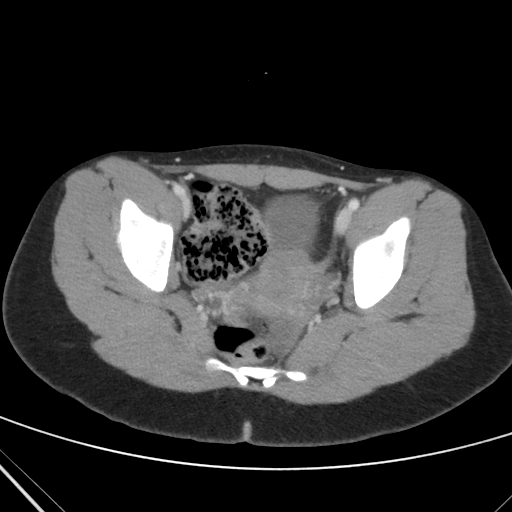
[im 23/82  soft-tissue]
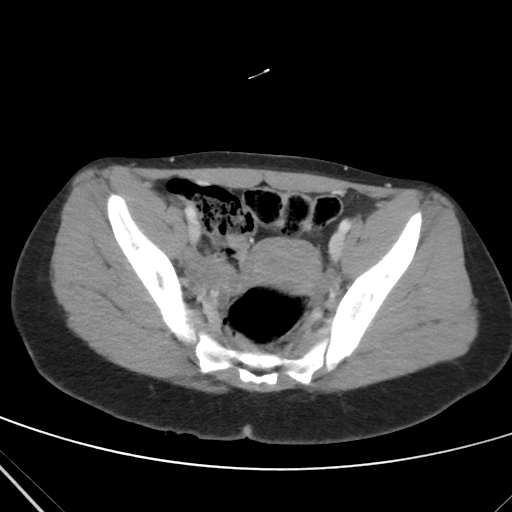
[im 30/82  soft-tissue]
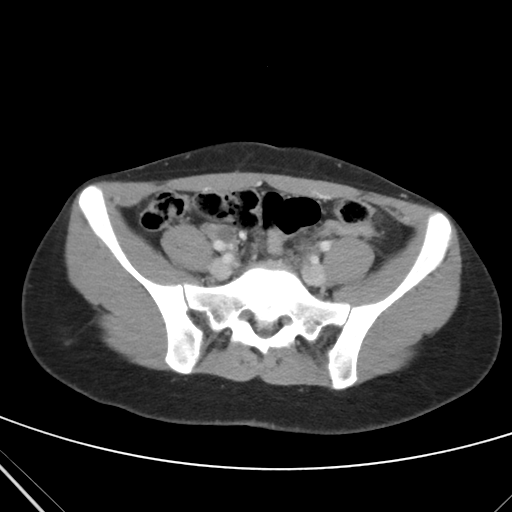
[im 34/82  soft-tissue]
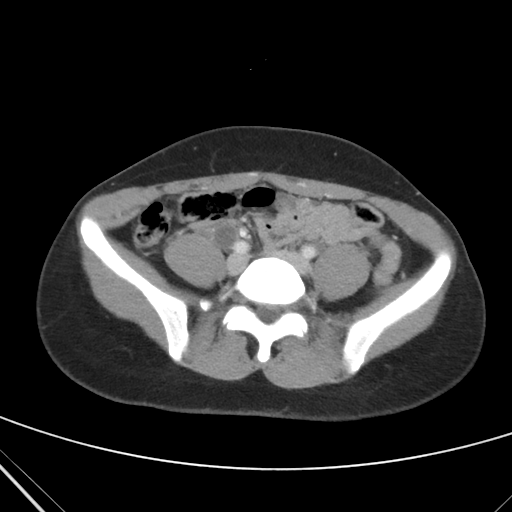
[im 41/82  soft-tissue]
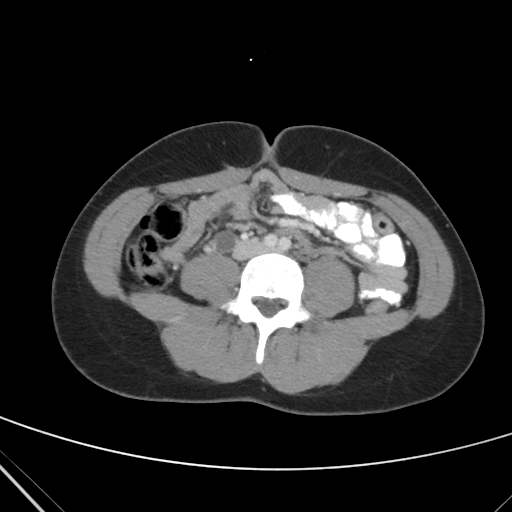
[im 48/82  soft-tissue]
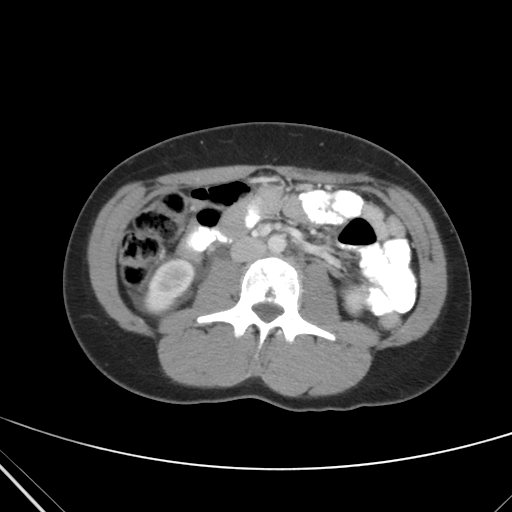
[im 52/82  soft-tissue]
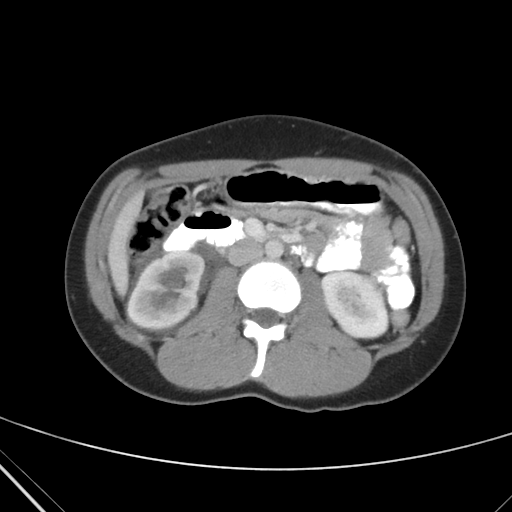
[im 52/82  bone]
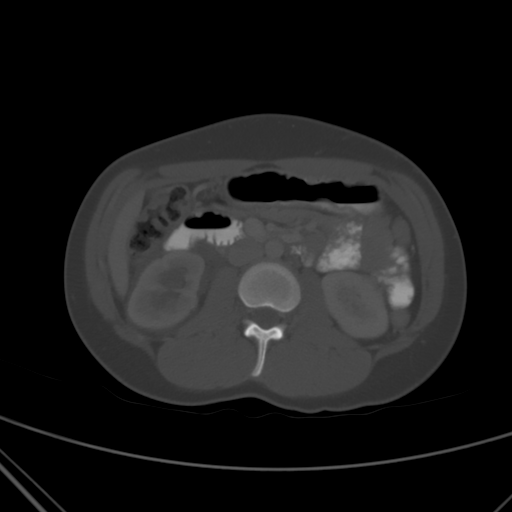
[im 59/82  soft-tissue]
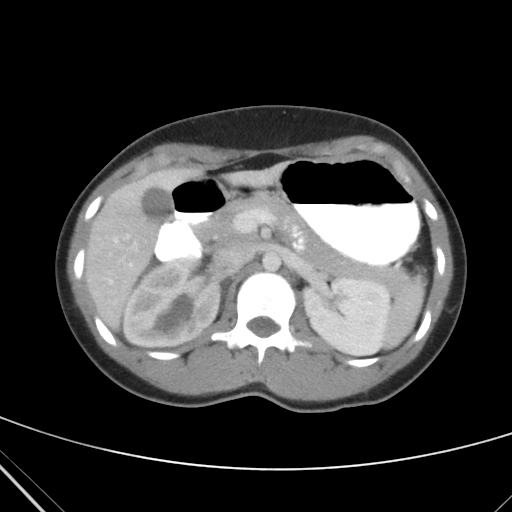
[im 63/82  soft-tissue]
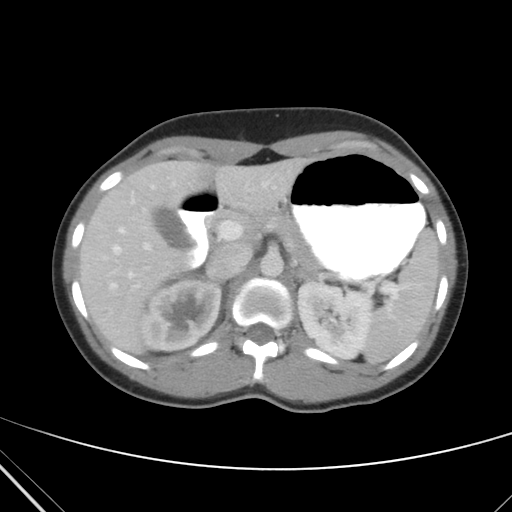
[im 70/82  soft-tissue]
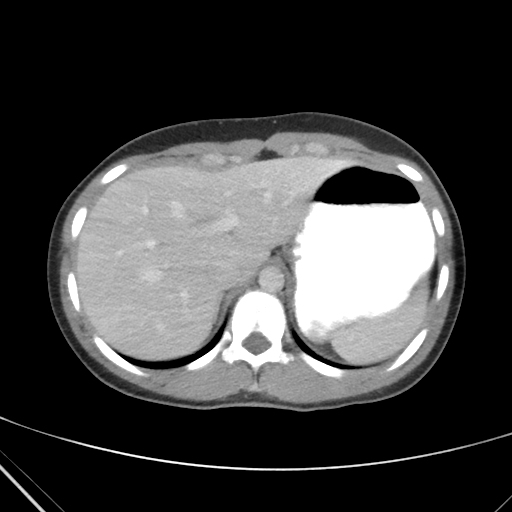
[im 78/82  soft-tissue]
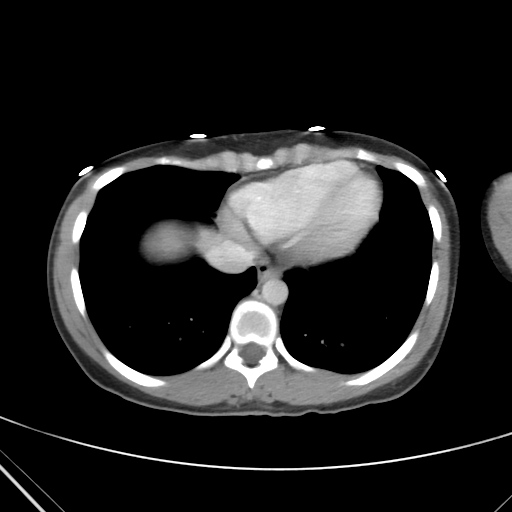

[Series 8040: cor · coronal · 0.69mm/px · 3 of 89 slices shown]
[im 30/89  soft-tissue]
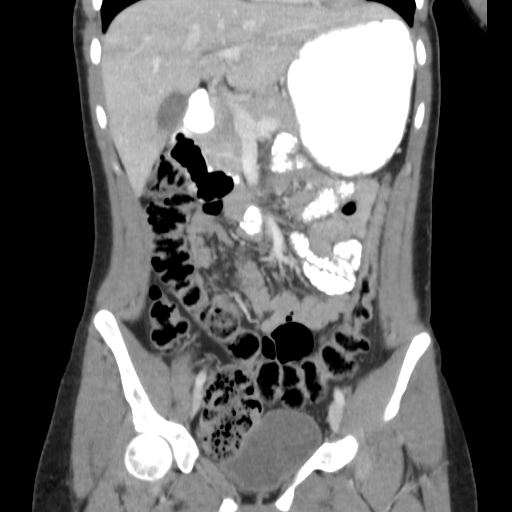
[im 40/89  soft-tissue]
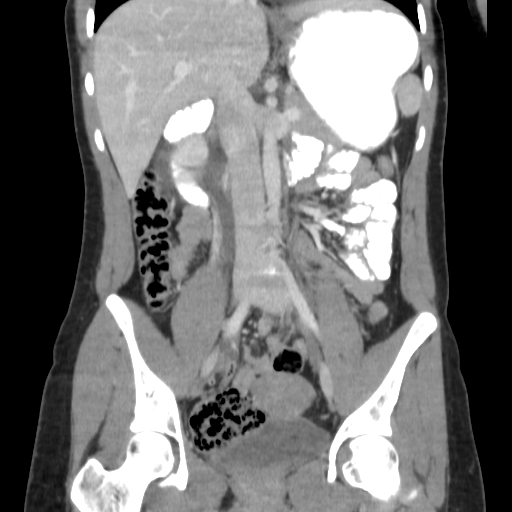
[im 49/89  soft-tissue]
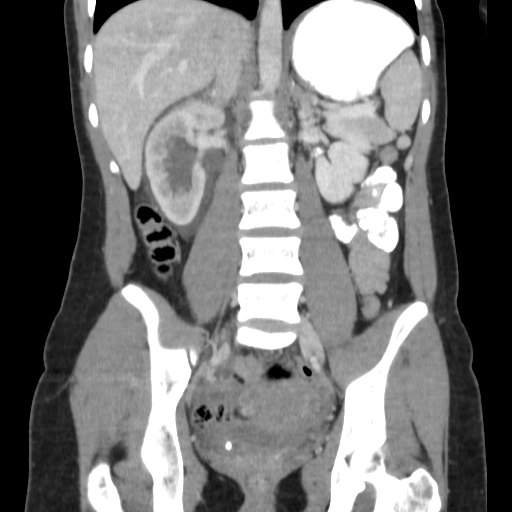

[16 of 46 positions shown; findings below may reference images not displayed]

FINDINGS: The visualized lung bases are clear.

Liver demonstrates a normal contrast enhanced appearance. The
gallbladder is within normal limits. The spleen, adrenal glands, and
pancreas demonstrate a normal contrast enhanced appearance.

Several nonobstructive stones are seen within the left kidney, the
largest of which is located within and lower pole caliectasis and
measures 5 mm. No obstructive stone seen on the left or along the
course of the left renal collecting system. There is no left-sided
hydronephrosis. No focal enhancing renal mass.

On the right, there is an obstructive 5 mm stone present at the
right UVJ (series 2, image 68). There is secondary moderate right
hydroureteronephrosis. Additional 3 mm stone is present within a
lower pole calyx in the right kidney. Small amount of perinephric
free fluid is present about the right kidney.

No evidence of bowel obstruction. The appendix is visualized in the
right lower quadrant and is of normal caliber and appearance without
associated inflammatory changes to suggest acute appendicitis. No
abnormal wall thickening, mucosal enhancement, or inflammatory fat
stranding seen about the bowels.

Bladder is unremarkable. Uterus and ovaries are within normal
limits. Trace free fluid is noted within the pelvis, likely
physiologic.

No free intraperitoneal air identified. No pathologically enlarged
intra-abdominal pelvic lymph nodes.

In osseous structures are within normal limits. No focal lytic or
blastic osseous lesions.
IMPRESSION: 1. Obstructive 5 mm stone at the right UVJ with secondary moderate
right hydroureteronephrosis.
2. Additional bilateral nonobstructive renal calculi as above.
3. Normal appendix.

## 2015-08-22 ENCOUNTER — Emergency Department (HOSPITAL_COMMUNITY)
Admission: EM | Admit: 2015-08-22 | Discharge: 2015-08-22 | Disposition: A | Discharge status: Home / Self Care | Payer: Medicaid - Out of State | Attending: Emergency Medicine | Admitting: Emergency Medicine

## 2015-08-22 ENCOUNTER — Encounter (HOSPITAL_COMMUNITY): Payer: Self-pay

## 2015-08-22 DIAGNOSIS — R000 Tachycardia, unspecified: Secondary | ICD-10-CM | POA: Insufficient documentation

## 2015-08-22 DIAGNOSIS — J020 Streptococcal pharyngitis: Secondary | ICD-10-CM | POA: Insufficient documentation

## 2015-08-22 LAB — RAPID STREP SCREEN (MED CTR MEBANE ONLY): Streptococcus, Group A Screen (Direct): POSITIVE — AB

## 2015-08-22 MED ORDER — PENICILLIN G BENZATHINE 1200000 UNIT/2ML IM SUSP
1.2000 10*6.[IU] | Freq: Once | INTRAMUSCULAR | Status: AC
  Administered 2015-08-22: 1.2 10*6.[IU] via INTRAMUSCULAR
  Filled 2015-08-22: qty 2

## 2015-08-22 MED ORDER — ACETAMINOPHEN 325 MG PO TABS
650.0000 mg | ORAL_TABLET | Freq: Once | ORAL | Status: AC | PRN
  Administered 2015-08-22: 650 mg via ORAL

## 2015-08-22 MED ORDER — ACETAMINOPHEN 325 MG PO TABS
ORAL_TABLET | ORAL | Status: AC
  Filled 2015-08-22: qty 2

## 2015-08-22 NOTE — ED Provider Notes (Signed)
[facility]-EMERGENCY DEPT Provider Note   CSN: [id number] Arrival date & time: 08/22/15  1142  First Provider Contact:  None    By signing my name below, I, Doreatha Martin, attest that this documentation has been prepared under the direction and in the presence of Roxy Horseman, PA-C. Electronically Signed: Doreatha Martin, ED Scribe. 08/22/15. 12:34 PM.    History   Chief Complaint Chief Complaint  Patient presents with  . Sore Throat    HPI Diana Castillo is a 25 y.o. female otherwise healthy who presents to the Emergency Department complaining of moderate sore throat onset 2 days ago with associated fever, HA. Pt states that pain is worsened with swallowing. She reports she has taken ibuprofen with some relief of pain. Pt reports adequate food and fluid intake. No known sick contacts with similar symptoms. She denies difficulty tolerating secretions or swallowing.  The history is provided by the patient. No language interpreter was used.    History reviewed. No pertinent past medical history.  There are no active problems to display for this patient.   History reviewed. No pertinent surgical history.  OB History    No data available       Home Medications    Prior to Admission medications   Medication Sig Start Date End Date Taking? Authorizing Provider  cyclobenzaprine (FLEXERIL) 10 MG tablet Take 1 tablet (10 mg total) by mouth 2 (two) times daily as needed for muscle spasms. 04/23/13   Marissa Sciacca, PA-C  hydrOXYzine (ATARAX/VISTARIL) 25 MG tablet Take 1 tablet (25 mg total) by mouth every 6 (six) hours. 03/08/15   Melton Krebs, PA-C  ibuprofen (ADVIL,MOTRIN) 200 MG tablet Take 200-400 mg by mouth every 6 (six) hours as needed for fever, headache, mild pain, moderate pain or cramping.    Historical Provider, MD  medroxyPROGESTERone (DEPO-PROVERA) 150 MG/ML injection Inject 150 mg into the muscle every 3 (three) months.    Historical Provider, MD  permethrin  (ELIMITE) 5 % cream Apply to affected area once 03/08/15   Melton Krebs, PA-C    Family History History reviewed. No pertinent family history.  Social History Social History  Substance Use Topics  . Smoking status: Never Smoker  . Smokeless tobacco: Never Used  . Alcohol use Yes     Comment: socially     Allergies   Review of patient's allergies indicates no known allergies.   Review of Systems Review of Systems  Constitutional: Positive for fever.  HENT: Positive for sore throat. Negative for trouble swallowing.   Neurological: Positive for headaches.     Physical Exam Updated Vital Signs BP 120/84 (BP Location: Right Arm)   Pulse (!) 124   Temp 101.1 F (38.4 C) (Oral)   Resp 16   SpO2 100%   Physical Exam Physical Exam  Constitutional: Pt appears well-developed and well-nourished. No distress.  Awake, alert, nontoxic appearance  HENT:  Head: Normocephalic and atraumatic.  Mouth/Throat: No oropharyngeal exudate. Mild posterior oropharyngeal erythema. No tonsillar exudate or abscess. Uvula midline. Airway intact. No stridor.  Eyes: Conjunctivae are normal. No scleral icterus.  Neck: Normal range of motion. Neck supple. Positive cervical adenopathy.  Cardiovascular: Regular rhythm and intact distal pulses.  Tachycardic.  Pulmonary/Chest: Effort normal and breath sounds normal. No respiratory distress. Pt has no wheezes.  Equal chest expansion  Abdominal: Soft. Bowel sounds are normal. Pt exhibits no mass. There is no tenderness. There is no rebound and no guarding.  Musculoskeletal: Normal range of motion.  Pt exhibits no edema.  Neurological: Pt is alert.  Speech is clear and goal oriented Moves extremities without ataxia  Skin: Skin is warm and dry. Pt is not diaphoretic. No rash.  Psychiatric: Pt has a normal mood and affect.  Nursing note and vitals reviewed.    ED Treatments / Results  Labs (all labs ordered are listed, but only abnormal results  are displayed) Labs Reviewed  RAPID STREP SCREEN (NOT AT [facility]) - Abnormal; Notable for the following:       Result Value   Streptococcus, Group A Screen (Direct) POSITIVE (*)    All other components within normal limits    Procedures Procedures (including critical care time)  DIAGNOSTIC STUDIES: Oxygen Saturation is 100% on RA, normal by my interpretation.    COORDINATION OF CARE: 12:30 PM Discussed treatment plan with pt at bedside which includes rapid strep, increased fluid intake, penicillin and pt agreed to plan.    Medications Ordered in ED Medications  penicillin g benzathine (BICILLIN LA) 1200000 UNIT/2ML injection 1.2 Million Units (not administered)  acetaminophen (TYLENOL) tablet 650 mg (650 mg Oral Given 08/22/15 1150)     Initial Impression / Assessment and Plan / ED Course  I have reviewed the triage vital signs and the nursing notes.  Pertinent labs & imaging results that were available during my care of the patient were reviewed by me and considered in my medical decision making (see chart for details).  Clinical Course    Patient with strep throat.  Pen IM in ED.  Recommend lots of fluids.  Tylenol/ibuprofen for pain.  PCP follow-up or ED for worsening symptoms.  Final Clinical Impressions(s) / ED Diagnoses   Final diagnoses:  Strep throat    New Prescriptions New Prescriptions   No medications on file    I personally performed the services described in this documentation, which was scribed in my presence. The recorded information has been reviewed and is accurate.      Roxy Horseman, PA-C 08/22/15 1242    Raeford Razor, MD 08/26/15 [phone]

## 2015-09-14 ENCOUNTER — Encounter (HOSPITAL_COMMUNITY): Payer: Self-pay | Admitting: *Deleted

## 2015-09-14 ENCOUNTER — Emergency Department (HOSPITAL_COMMUNITY)
Admission: EM | Admit: 2015-09-14 | Discharge: 2015-09-14 | Disposition: A | Discharge status: Home / Self Care | Payer: Medicaid - Out of State | Attending: Emergency Medicine | Admitting: Emergency Medicine

## 2015-09-14 DIAGNOSIS — R109 Unspecified abdominal pain: Secondary | ICD-10-CM

## 2015-09-14 DIAGNOSIS — N200 Calculus of kidney: Secondary | ICD-10-CM

## 2015-09-14 LAB — URINALYSIS, ROUTINE W REFLEX MICROSCOPIC
BILIRUBIN URINE: NEGATIVE
Glucose, UA: NEGATIVE mg/dL
KETONES UR: NEGATIVE mg/dL
Leukocytes, UA: NEGATIVE
Nitrite: NEGATIVE
PROTEIN: NEGATIVE mg/dL
Specific Gravity, Urine: 1.022 (ref 1.005–1.030)
pH: 6.5 (ref 5.0–8.0)

## 2015-09-14 LAB — POC URINE PREG, ED: PREG TEST UR: NEGATIVE

## 2015-09-14 LAB — URINE MICROSCOPIC-ADD ON

## 2015-09-14 MED ORDER — CEPHALEXIN 500 MG PO CAPS: 500.0000 mg | ORAL_CAPSULE | Freq: Four times a day (QID) | ORAL | 0 refills | Status: AC

## 2015-09-14 MED ORDER — KETOROLAC TROMETHAMINE 60 MG/2ML IM SOLN
60.0000 mg | Freq: Once | INTRAMUSCULAR | Status: AC
  Administered 2015-09-14: 60 mg via INTRAMUSCULAR
  Filled 2015-09-14: qty 2

## 2015-09-14 MED ORDER — ONDANSETRON 8 MG PO TBDP: 8.0000 mg | ORAL_TABLET | Freq: Three times a day (TID) | ORAL | 0 refills | Status: AC | PRN

## 2015-09-14 MED ORDER — OXYCODONE-ACETAMINOPHEN 5-325 MG PO TABS
2.0000 | ORAL_TABLET | ORAL | 0 refills | Status: AC | PRN
Start: 2015-09-14 — End: ?

## 2015-09-14 MED ORDER — OXYCODONE-ACETAMINOPHEN 5-325 MG PO TABS
2.0000 | ORAL_TABLET | Freq: Once | ORAL | Status: AC
  Administered 2015-09-14: 2 via ORAL
  Filled 2015-09-14: qty 2

## 2015-09-14 NOTE — Discharge Instructions (Signed)
Please call [facility] for recheck in 1-2 weeks.  Take all medicines as prescribed.

## 2015-09-14 NOTE — ED Provider Notes (Signed)
[facility]PT Provider Note   CSN: [id number] Arrival date & time: 09/14/15  0710     History   Chief Complaint Chief Complaint  Patient presents with  . Dysuria  . Vaginal Discharge    HPI Baldemar LenisLaquanda Melony OverlyCieona Kraeger is a 25 y.o. female.  HPI   25 year old female G1 P1 on Depo-Provera shots presents today complaining of left flank and inguinal pain that is crampy and severe. She states that she has had to urinate frequently and has noted some blood in her urine. She has a history of kidney stones and feels like this is consistent with previous kidney stones. She denies any signs or symptoms of pregnancy. She states that she is sexually active but had not had any recent sexual activity. She has had some nausea but no vomiting. She states she did feel clammy with severe pain only while ago. The pain has been waxing and waning in nature. It began this morning. Is not taking anything for her pain. She denies any abnormal vaginal discharge, or pain with urination.  History reviewed. No pertinent past medical history.  There are no active problems to display for this patient.   History reviewed. No pertinent surgical history.  OB History    No data available       Home Medications    Prior to Admission medications   Medication Sig Start Date End Date Taking? Authorizing Provider  medroxyPROGESTERone (DEPO-PROVERA) 150 MG/ML injection Inject 150 mg into the muscle every 3 (three) months.   Yes Historical Provider, MD  cyclobenzaprine (FLEXERIL) 10 MG tablet Take 1 tablet (10 mg total) by mouth 2 (two) times daily as needed for muscle spasms. Patient not taking: Reported on 09/14/2015 04/23/13   Marissa Sciacca, PA-C  hydrOXYzine (ATARAX/VISTARIL) 25 MG tablet Take 1 tablet (25 mg total) by mouth every 6 (six) hours. Patient not taking: Reported on 09/14/2015 03/08/15   Melton KrebsSamantha Nicole Riley, PA-C  ibuprofen (ADVIL,MOTRIN) 200 MG tablet Take 600 mg by mouth every 6 (six) hours as  needed for fever, headache, mild pain, moderate pain or cramping.     Historical Provider, MD  permethrin (ELIMITE) 5 % cream Apply to affected area once Patient not taking: Reported on 09/14/2015 03/08/15   Melton KrebsSamantha Nicole Riley, PA-C    Family History History reviewed. No pertinent family history.  Social History Social History  Substance Use Topics  . Smoking status: Never Smoker  . Smokeless tobacco: Never Used  . Alcohol use Yes     Comment: socially     Allergies   Review of patient's allergies indicates no known allergies.   Review of Systems Review of Systems  All other systems reviewed and are negative.    Physical Exam Updated Vital Signs BP 116/84 (BP Location: Left Arm)   Pulse 92   Temp 98.3 F (36.8 C) (Oral)   Resp 18   SpO2 100%   Physical Exam  Constitutional: She is oriented to person, place, and time. She appears well-developed and well-nourished. No distress.  HENT:  Head: Normocephalic and atraumatic.  Right Ear: External ear normal.  Left Ear: External ear normal.  Nose: Nose normal.  Eyes: Conjunctivae and EOM are normal. Pupils are equal, round, and reactive to light.  Neck: Normal range of motion. Neck supple.  Cardiovascular: Normal rate, regular rhythm, normal heart sounds and intact distal pulses.   No murmur heard. Pulmonary/Chest: Effort normal.  Abdominal: Soft. Bowel sounds are normal. She exhibits no distension.  Genitourinary:  Genitourinary  Comments: No CVA tenderness  Musculoskeletal: Normal range of motion.  Neurological: She is alert and oriented to person, place, and time. She exhibits normal muscle tone. Coordination normal.  Skin: Skin is warm and dry.  Psychiatric: She has a normal mood and affect. Her behavior is normal. Thought content normal.  Nursing note and vitals reviewed.    ED Treatments / Results  Labs (all labs ordered are listed, but only abnormal results are displayed) Labs Reviewed  URINALYSIS,  ROUTINE W REFLEX MICROSCOPIC (NOT AT [facility]) - Abnormal; Notable for the following:       Result Value   APPearance CLOUDY (*)    Hgb urine dipstick LARGE (*)    All other components within normal limits  URINE MICROSCOPIC-ADD ON - Abnormal; Notable for the following:    Squamous Epithelial / LPF 0-5 (*)    Bacteria, UA FEW (*)    All other components within normal limits  POC URINE PREG, ED    EKG  EKG Interpretation None       Radiology No results found.  Procedures Procedures (including critical care time)  Medications Ordered in ED Medications  ketorolac (TORADOL) injection 60 mg (not administered)  oxyCODONE-acetaminophen (PERCOCET/ROXICET) 5-325 MG per tablet 2 tablet (not administered)     Initial Impression / Assessment and Plan / ED Course  I have reviewed the triage vital signs and the nursing notes.  Pertinent labs & imaging results that were available during my care of the patient were reviewed by me and considered in my medical decision making (see chart for details).  Clinical Course    25 year old female with left sided abdominal pain consistent with previous kidney stones who presents today the pain started today and is like her previous kidney stone pain. She has hematuria here in the ED. Pain has resolved with IM Toradol and 2 by mouth Percocet. Pregnancy test is negative. I have reviewed previous CTs she has had multiple kidney stones. She is referred to urology for follow-up. She is advised regarding return precautions and need for follow-up. She is discharged with 10 Percocet, Zofran, and Keflex.  Final Clinical Impressions(s) / ED Diagnoses   Final diagnoses:  Acute left flank pain  Kidney stones    New Prescriptions New Prescriptions   No medications on file     Margarita Grizzleanielle Ladeja Pelham, MD 09/14/15 1134

## 2015-09-14 NOTE — ED Triage Notes (Signed)
Pt reports frequent urination, pain when urinating, lower abd pain and vaginal discharge.
